# Patient Record
Sex: Male | Born: 1970
Health system: Southern US, Community
[De-identification: ages and names within clinical notes are randomized; demographics above are authoritative.]

## PROBLEM LIST (undated history)

## (undated) DIAGNOSIS — M1990 Unspecified osteoarthritis, unspecified site: Secondary | ICD-10-CM

## (undated) DIAGNOSIS — G709 Myoneural disorder, unspecified: Secondary | ICD-10-CM

## (undated) HISTORY — PX: VASECTOMY: SHX75

## (undated) HISTORY — PX: TOE SURGERY: SHX1073

## (undated) HISTORY — DX: Unspecified osteoarthritis, unspecified site: M19.90

## (undated) HISTORY — PX: EYE SURGERY: SHX253

## (undated) HISTORY — DX: Myoneural disorder, unspecified: G70.9

## (undated) HISTORY — PX: KNEE SURGERY: SHX244

---

## 2006-07-18 ENCOUNTER — Encounter (INDEPENDENT_AMBULATORY_CARE_PROVIDER_SITE_OTHER): Payer: Self-pay | Admitting: Specialist

## 2006-07-18 ENCOUNTER — Ambulatory Visit (HOSPITAL_BASED_OUTPATIENT_CLINIC_OR_DEPARTMENT_OTHER): Admission: RE | Admit: 2006-07-18 | Discharge: 2006-07-18 | Payer: Self-pay | Admitting: Urology

## 2006-08-25 ENCOUNTER — Encounter: Admission: RE | Admit: 2006-08-25 | Discharge: 2006-08-25 | Payer: Self-pay | Admitting: Internal Medicine

## 2009-09-07 ENCOUNTER — Ambulatory Visit (HOSPITAL_BASED_OUTPATIENT_CLINIC_OR_DEPARTMENT_OTHER): Admission: RE | Admit: 2009-09-07 | Discharge: 2009-09-07 | Payer: Self-pay | Admitting: Orthopedic Surgery

## 2010-06-25 LAB — POCT HEMOGLOBIN-HEMACUE: Hemoglobin: 14.7 g/dL (ref 13.0–17.0)

## 2010-08-24 NOTE — Op Note (Signed)
NAME:  DAIMIAN, SUDBERRY              ACCOUNT NO.:  [id number]   MEDICAL RECORD NO.:  [id number]          PATIENT TYPE:  AMB   LOCATION:  [facility]                         FACILITY:  [facility]   PHYSICIAN:  Sigmund I. Patsi Sears, M.D.DATE OF BIRTH:  [birth year]   DATE OF PROCEDURE:  07/18/2006  DATE OF DISCHARGE:                               OPERATIVE REPORT   PREOP DIAGNOSIS:  Left varicocele, and elective sterilization.   POSTOP DIAGNOSIS.:  Left varicocele, and elective sterilization.   OPERATION:  Left internal splenic vein ligation and vasectomy.   SURGEON:  Jethro Bolus, M.D.   ANESTHESIA:  General LMA.   PREPARATION:  After appropriate preanesthesia, the patient was brought  to the operating room and placed supine on the operating room table in a  dorsal supine position where a general LMA anesthesia was induced.  He  remained in this position, where the left inguinal area was washed,  shaved, prepped with Betadine solution, and draped in the usual fashion.   REVIEW OF HISTORY:  This 40 year old man desires to have elective  sterilization, as well as left internal splenic vein ligation for  varicocele, and chronic left testicular pain.  The attention was then  directed to the right hemiscrotum, where the vas was identified.  A 0.5  cm incision was made, subcutaneous tissue dissected, the vas delivered  into the wound.  The vas is doubly clamped, and portion excised with the  electrosurgical unit.  Each end is then ligated with 3-0 Vicryl suture.  The wound was closed in a single 3-0 Vicryl suture. The anesthetic is  delivered into the spermatic cord and the skin wound.   Attention was then directed to the left inguinal canal.  The pubic  tubercle was identified, and a horizontal incision is made measuring 3  cm, two fingerbreadths above the pubic tubercle on the left side.  The  subcutaneous tissue was dissected.  The spermatic cord was identified  distal to the external  ring, and a right-angle clamp was used to dissect  at the level of the pubic tubercle.  A Penrose drain is placed behind  the spermatic cord.  The vas was identified, isolated and doubly  clamped, and a portion removed with the electrosurgical unit.  Each end  is then ligated with 3-0 Vicryl suture.   Attention was then directed to the ring and spermatic cord where  multiple large veins were identified and these were dissected and  ligated with 3-0 ilk free ties.  This tissue was sent to the laboratory  on the left side and labeled as vas plus left internal spermatic veins.  The artery was identified and left intact at the posterior portion of  the spermatic cord.  The spermatic cord was anesthetized with 0.25 plain  Marcaine superiorly and inferiorly, and this was injected into the wound  edges as well.   Closure:  Subcutaneous tissue was closed with running 3-0 Vicryl suture,  and the skin was closed with 4-0 Monocryl running subcuticular closure.  Benzoin and Steri-Strips were applied and the patient was given IV  Toradol.  He was awakened  and taken to the recovery room in good  condition.      Sigmund I. Patsi Sears, M.D.  Electronically Signed     SIT/MEDQ  D:  07/18/2006  T:  07/18/2006  Job:  [id number]

## 2011-05-13 ENCOUNTER — Ambulatory Visit (INDEPENDENT_AMBULATORY_CARE_PROVIDER_SITE_OTHER): Payer: BC Managed Care – PPO | Admitting: Internal Medicine

## 2011-05-13 DIAGNOSIS — F988 Other specified behavioral and emotional disorders with onset usually occurring in childhood and adolescence: Secondary | ICD-10-CM

## 2011-05-13 DIAGNOSIS — G4700 Insomnia, unspecified: Secondary | ICD-10-CM | POA: Insufficient documentation

## 2011-05-13 DIAGNOSIS — M25569 Pain in unspecified knee: Secondary | ICD-10-CM | POA: Insufficient documentation

## 2011-05-13 DIAGNOSIS — M545 Low back pain, unspecified: Secondary | ICD-10-CM

## 2011-05-13 DIAGNOSIS — M159 Polyosteoarthritis, unspecified: Secondary | ICD-10-CM

## 2011-05-13 DIAGNOSIS — F909 Attention-deficit hyperactivity disorder, unspecified type: Secondary | ICD-10-CM

## 2011-05-13 DIAGNOSIS — F4541 Pain disorder exclusively related to psychological factors: Secondary | ICD-10-CM | POA: Insufficient documentation

## 2011-05-13 DIAGNOSIS — F32A Depression, unspecified: Secondary | ICD-10-CM

## 2011-05-13 DIAGNOSIS — M1990 Unspecified osteoarthritis, unspecified site: Secondary | ICD-10-CM

## 2011-05-13 DIAGNOSIS — M549 Dorsalgia, unspecified: Secondary | ICD-10-CM

## 2011-05-13 DIAGNOSIS — F329 Major depressive disorder, single episode, unspecified: Secondary | ICD-10-CM

## 2011-05-13 MED ORDER — AMPHETAMINE-DEXTROAMPHETAMINE 20 MG PO TABS: 20.0000 mg | ORAL_TABLET | Freq: Two times a day (BID) | ORAL | Status: DC

## 2011-05-13 MED ORDER — ZOLPIDEM TARTRATE 10 MG PO TABS: 10.0000 mg | ORAL_TABLET | Freq: Every evening | ORAL | Status: DC | PRN

## 2011-05-13 MED ORDER — BUPROPION HCL ER (XL) 300 MG PO TB24: 300.0000 mg | ORAL_TABLET | Freq: Every day | ORAL | Status: DC

## 2011-05-13 MED ORDER — CYCLOBENZAPRINE HCL 10 MG PO TABS: 10.0000 mg | ORAL_TABLET | Freq: Three times a day (TID) | ORAL | Status: DC | PRN

## 2011-05-14 ENCOUNTER — Encounter: Payer: Self-pay | Admitting: Internal Medicine

## 2011-05-14 DIAGNOSIS — F329 Major depressive disorder, single episode, unspecified: Secondary | ICD-10-CM | POA: Insufficient documentation

## 2011-05-14 DIAGNOSIS — F32A Depression, unspecified: Secondary | ICD-10-CM | POA: Insufficient documentation

## 2011-05-14 LAB — URIC ACID: Uric Acid, Serum: 5.9 mg/dL (ref 4.0–7.8)

## 2011-05-14 LAB — SEDIMENTATION RATE: Sed Rate: 5 mm/hr (ref 0–16)

## 2011-05-14 LAB — RHEUMATOID FACTOR: Rhuematoid fact SerPl-aCnc: <10 IU/mL (ref <=14)

## 2011-05-14 NOTE — Progress Notes (Signed)
  Subjective:    Patient ID: [facility] Miller, male    DOB: 1970-08-28, 41 y.o.   MRN: [id number]  HPIRoger comes in followup of several chronic problems. He has had remarkable progress since starting Adderall for ADD in the fall of 2012. He has had a remarkable effect on his depression and his overall productivity in life. Everyone around him has noticed a difference. He's had no side effects with the medication at this point  Depression and anxiety are well controlled at this point with Wellbutrin. He also has had great success with sleeping after using Ambien. He has no early morning amnesia.  His recent visits to urology for chronic testicular pain  has not resulted in a diagnosis but has led to an attempt to control this with gabapentin which is not successful. We may have to consider radiation from the sacral area has a radiculopathy at some point. He has several degenerative disease processes in his bones and joints. He has had more than one knee surgery and has chronic pain in one knee. He has a lot of back pain which he lives with.he has stiffness in several joints including his hands in the morning. Part of this can be contributes to his difficult work but underlying arthritis is possible and he has never had a consult with rheumatology.    Review of Systems  Constitutional: Negative.   HENT: Negative.   Eyes: Negative for pain and discharge.       Although there are no current complaints his history of eyelid surgery is important.  Respiratory: Negative.   Cardiovascular: Negative.   Gastrointestinal: Negative.   Neurological: Negative for weakness and numbness.       He has chronic tension headaches which currently are responding well to Flexeril at bedtime. These may do well related to chronic neck muscles tenderness.  Psychiatric/Behavioral: Negative for hallucinations, behavioral problems, decreased concentration and agitation.       Objective:   Physical Exam  Constitutional:  He is oriented to person, place, and time. He appears well-developed.  HENT:  Head: Normocephalic.  Eyes: Conjunctivae are normal. Pupils are equal, round, and reactive to light.  Neck: Neck supple. No thyromegaly present.  Cardiovascular: Normal rate and regular rhythm.   Pulmonary/Chest: Breath sounds normal.  Musculoskeletal: Normal range of motion.  Neurological: He is oriented to person, place, and time. He displays normal reflexes.  Psychiatric: He has a normal mood and affect. His behavior is normal. Judgment and thought content normal.  there is marked tightness and some tenderness in the trapezii and posterior cervical muscles with a forward posture. The lumbar area is generally tender although straight leg raise is negative bilaterally. Hands have stiffness at PIP joints without dramatic changes of arthritis.        Assessment & Plan:  Problem #1 chronic degenerative joint problems-a rheumatoid panel will be done with consideration for referral. He may also need chronic pain consideration at some point. He is encouraged to avoid narcotics for pain control at this point.  Problem #2 ADD-doing very well at this point and Adderall be continued  Problem #3 chronic knee pain-he may followup with Dr. Thurston Hole  Problem #4 chronic testicular pain  Problem #5 stress related headaches-continue Flexeril Hs  Problem #6 insomnia-continue Ambien  We will call with his lab results and plan. He may call in 3 months if everything is stable for another 3 months and Adderall.

## 2011-05-18 ENCOUNTER — Encounter: Payer: Self-pay | Admitting: Internal Medicine

## 2011-05-23 ENCOUNTER — Telehealth: Payer: Self-pay

## 2011-05-23 NOTE — Telephone Encounter (Signed)
Pt was seen 05/13/11  [facility] states pt is in office now and they need his last bloodwork Fax [phone] He is there now.

## 2011-05-23 NOTE — Telephone Encounter (Signed)
Need signed release to send records.  Faxed notice to [facility] at 9:55am. Still waiting on release.

## 2011-07-12 ENCOUNTER — Other Ambulatory Visit: Payer: Self-pay | Admitting: Internal Medicine

## 2011-08-07 ENCOUNTER — Other Ambulatory Visit: Payer: Self-pay | Admitting: Physician Assistant

## 2011-08-09 ENCOUNTER — Ambulatory Visit (INDEPENDENT_AMBULATORY_CARE_PROVIDER_SITE_OTHER): Payer: BC Managed Care – PPO | Admitting: Internal Medicine

## 2011-08-09 VITALS — BP 119/73 | HR 89 | Temp 98.1°F | Resp 16 | Ht 70.5 in | Wt 182.8 lb

## 2011-08-09 DIAGNOSIS — F32A Depression, unspecified: Secondary | ICD-10-CM

## 2011-08-09 DIAGNOSIS — F329 Major depressive disorder, single episode, unspecified: Secondary | ICD-10-CM

## 2011-08-09 DIAGNOSIS — M542 Cervicalgia: Secondary | ICD-10-CM

## 2011-08-09 DIAGNOSIS — F909 Attention-deficit hyperactivity disorder, unspecified type: Secondary | ICD-10-CM

## 2011-08-09 DIAGNOSIS — G4700 Insomnia, unspecified: Secondary | ICD-10-CM

## 2011-08-09 MED ORDER — AMPHETAMINE-DEXTROAMPHETAMINE 20 MG PO TABS: 20.0000 mg | ORAL_TABLET | Freq: Two times a day (BID) | ORAL | Status: DC

## 2011-08-09 MED ORDER — GABAPENTIN 100 MG PO CAPS: 300.0000 mg | ORAL_CAPSULE | Freq: Three times a day (TID) | ORAL | Status: DC
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
## 2011-08-21 ENCOUNTER — Other Ambulatory Visit: Payer: Self-pay | Admitting: Internal Medicine

## 2011-08-22 ENCOUNTER — Other Ambulatory Visit: Payer: Self-pay | Admitting: Physical Medicine and Rehabilitation

## 2011-08-22 ENCOUNTER — Telehealth: Payer: Self-pay

## 2011-08-22 ENCOUNTER — Other Ambulatory Visit (HOSPITAL_COMMUNITY): Payer: Self-pay | Admitting: Physical Medicine and Rehabilitation

## 2011-08-22 DIAGNOSIS — M542 Cervicalgia: Secondary | ICD-10-CM

## 2011-08-22 NOTE — Telephone Encounter (Signed)
Faxed over the requested information thru [employer].

## 2011-08-22 NOTE — Telephone Encounter (Signed)
[facility] is requesting a copy of pts recent labs, pt is in the office right now. Fax:[phone] Cb:[phone]

## 2011-08-26 ENCOUNTER — Other Ambulatory Visit (HOSPITAL_COMMUNITY): Payer: Self-pay

## 2011-08-26 ENCOUNTER — Ambulatory Visit
Admission: RE | Admit: 2011-08-26 | Discharge: 2011-08-26 | Disposition: A | Discharge status: Home / Self Care | Payer: Self-pay | Source: Ambulatory Visit | Attending: Physical Medicine and Rehabilitation | Admitting: Physical Medicine and Rehabilitation

## 2011-08-26 VITALS — BP 130/75 | HR 75

## 2011-08-26 DIAGNOSIS — M542 Cervicalgia: Secondary | ICD-10-CM

## 2011-08-26 MED ORDER — ONDANSETRON HCL 4 MG/2ML IJ SOLN: 4.0000 mg | Freq: Four times a day (QID) | INTRAMUSCULAR | Status: DC | PRN

## 2011-08-26 MED ORDER — DIAZEPAM 5 MG PO TABS
10.0000 mg | ORAL_TABLET | Freq: Once | ORAL | Status: AC
  Administered 2011-08-26: 10 mg via ORAL

## 2011-08-26 MED ORDER — IOHEXOL 300 MG/ML  SOLN
10.0000 mL | Freq: Once | INTRAMUSCULAR | Status: AC | PRN
  Administered 2011-08-26: 10 mL via INTRATHECAL

## 2011-08-26 NOTE — Discharge Instructions (Signed)
Myelogram Discharge Instructions  1. Go home and rest quietly for the next 24 hours.  It is important to lie flat for the next 24 hours.  Get up only to go to the restroom.  You may lie in the bed or on a couch on your back, your stomach, your left side or your right side.  You may have one pillow under your head.  You may have pillows between your knees while you are on your side or under your knees while you are on your back.  2. DO NOT drive today.  Recline the seat as far back as it will go, while still wearing your seat belt, on the way home.  3. You may get up to go to the bathroom as needed.  You may sit up for 10 minutes to eat.  You may resume your normal diet and medications unless otherwise indicated.  Drink lots of extra fluids today and tomorrow.  4. The incidence of headache, nausea, or vomiting is about 5% (one in 20 patients).  If you develop a headache, lie flat and drink plenty of fluids until the headache goes away.  Caffeinated beverages may be helpful.  If you develop severe nausea and vomiting or a headache that does not go away with flat bed rest, call [phone].  5. You may resume normal activities after your 24 hours of bed rest is over; however, do not exert yourself strongly or do any heavy lifting tomorrow. If when you get up you have a headache when standing, go back to bed and force fluids for another 24 hours.  6. Call your physician for a follow-up appointment.  The results of your myelogram will be sent directly to your physician by the following day.  7. If you have any questions or if complications develop after you arrive home, please call [phone].  Discharge instructions have been explained to the patient.  The patient, or the person responsible for the patient, fully understands these instructions.       May resume adderall and welbutrin on Aug 27, 2011, after 11:00 am.

## 2011-08-26 NOTE — Progress Notes (Addendum)
Pt states he has been off wellbutrin and adderall for the past 2 days. Discharge instructions explained and consent signed.  1104 pt is comfortable post myelo at present awaiting CT.dd

## 2011-09-23 ENCOUNTER — Other Ambulatory Visit: Payer: Self-pay | Admitting: Physician Assistant

## 2011-10-28 ENCOUNTER — Ambulatory Visit (INDEPENDENT_AMBULATORY_CARE_PROVIDER_SITE_OTHER): Payer: BC Managed Care – PPO | Admitting: Internal Medicine

## 2011-10-28 ENCOUNTER — Encounter: Payer: Self-pay | Admitting: Internal Medicine

## 2011-10-28 VITALS — BP 90/65 | HR 85 | Temp 98.5°F | Resp 16 | Ht 70.5 in | Wt 185.0 lb

## 2011-10-28 DIAGNOSIS — F909 Attention-deficit hyperactivity disorder, unspecified type: Secondary | ICD-10-CM

## 2011-10-28 DIAGNOSIS — F988 Other specified behavioral and emotional disorders with onset usually occurring in childhood and adolescence: Secondary | ICD-10-CM

## 2011-10-28 DIAGNOSIS — G4700 Insomnia, unspecified: Secondary | ICD-10-CM

## 2011-10-28 DIAGNOSIS — M542 Cervicalgia: Secondary | ICD-10-CM

## 2011-10-28 DIAGNOSIS — F329 Major depressive disorder, single episode, unspecified: Secondary | ICD-10-CM

## 2011-10-28 DIAGNOSIS — F32A Depression, unspecified: Secondary | ICD-10-CM

## 2011-10-28 MED ORDER — BUPROPION HCL ER (XL) 300 MG PO TB24: 300.0000 mg | ORAL_TABLET | Freq: Every day | ORAL | Status: DC

## 2011-10-28 MED ORDER — CYCLOBENZAPRINE HCL 10 MG PO TABS: 10.0000 mg | ORAL_TABLET | Freq: Every evening | ORAL | Status: DC | PRN

## 2011-10-28 MED ORDER — AMPHETAMINE-DEXTROAMPHETAMINE 20 MG PO TABS: 20.0000 mg | ORAL_TABLET | Freq: Two times a day (BID) | ORAL | Status: DC

## 2011-10-28 MED ORDER — ZOLPIDEM TARTRATE 10 MG PO TABS: 10.0000 mg | ORAL_TABLET | Freq: Every evening | ORAL | Status: DC | PRN

## 2011-10-28 NOTE — Progress Notes (Signed)
  Subjective:    Patient ID: [facility] Miller, male    DOB: 1970-09-17, 41 y.o.   MRN: [id number]  HPIdoing well considering c-spine injections Patient Active Problem List  Diagnosis  . Osteoarthritis  . Back pain  . Knee pain  . Stress headaches  . ADHD (attention deficit hyperactivity disorder)  . Insomnia  . Depression  . Neck pain-see myelogram  here for refills w/ no new complaints     Review of SystemsNoncontributory     Objective:   Physical Exam Blood pressure 90/65-asymptomatic Neuropsychiatric intact       Assessment & Plan:   1. Depression  buPROPion (WELLBUTRIN XL) 300 MG 24 hr tablet  2. Neck pain  cyclobenzaprine (FLEXERIL) 10 MG tablet  3. Insomnia  zolpidem (AMBIEN) 10 MG tablet  4. ADHD (attention deficit hyperactivity disorder)  amphetamine-dextroamphetamine (ADDERALL) 20 MG tablet, amphetamine-dextroamphetamine (ADDERALL) 20 MG tablet, amphetamine-dextroamphetamine (ADDERALL) 20 MG tablet   Meds ordered this encounter  Medications  . amphetamine-dextroamphetamine (ADDERALL) 20 MG tablet    Sig: Take 1 tablet (20 mg total) by mouth 2 (two) times daily.    Dispense:  60 tablet    Refill:  0  . amphetamine-dextroamphetamine (ADDERALL) 20 MG tablet    Sig: Take 1 tablet (20 mg total) by mouth 2 (two) times daily.    Dispense:  60 tablet    Refill:  0  . amphetamine-dextroamphetamine (ADDERALL) 20 MG tablet    Sig: Take 1 tablet (20 mg total) by mouth 2 (two) times daily.    Dispense:  60 tablet    Refill:  0  . buPROPion (WELLBUTRIN XL) 300 MG 24 hr tablet    Sig: Take 1 tablet (300 mg total) by mouth daily.    Dispense:  90 tablet    Refill:  1  . cyclobenzaprine (FLEXERIL) 10 MG tablet    Sig: Take 1 tablet (10 mg total) by mouth at bedtime as needed for muscle spasms.    Dispense:  90 tablet    Refill:  1  . zolpidem (AMBIEN) 10 MG tablet    Sig: Take 1 tablet (10 mg total) by mouth at bedtime as needed.    Dispense:  30 tablet    Refill:   5   May call in 3 months for adderall refills if all else is stable// plan followup in 6 months

## 2011-11-26 ENCOUNTER — Encounter: Payer: Self-pay | Admitting: Internal Medicine

## 2012-02-04 ENCOUNTER — Telehealth: Payer: Self-pay

## 2012-02-04 DIAGNOSIS — F909 Attention-deficit hyperactivity disorder, unspecified type: Secondary | ICD-10-CM

## 2012-02-04 DIAGNOSIS — F32A Depression, unspecified: Secondary | ICD-10-CM

## 2012-02-04 DIAGNOSIS — F329 Major depressive disorder, single episode, unspecified: Secondary | ICD-10-CM

## 2012-02-04 DIAGNOSIS — G4700 Insomnia, unspecified: Secondary | ICD-10-CM

## 2012-02-04 MED ORDER — AMPHETAMINE-DEXTROAMPHETAMINE 20 MG PO TABS: 20.0000 mg | ORAL_TABLET | Freq: Two times a day (BID) | ORAL | Status: DC

## 2012-02-04 NOTE — Telephone Encounter (Signed)
I have signed 3 months of Adderall refills.  He needs an appointment with Dr. Merla Riches before these run out in January.  He was given Wellbutrin #90 with 1 RF and Ambien #30 with 5 RFs on 10/28/11 so it is too soon to refill those.

## 2012-02-04 NOTE — Telephone Encounter (Signed)
Pt is requesting a 3 month refill on the following medications; adderall, ambien and bupropion. Please call pt to advise if can refill

## 2012-02-04 NOTE — Telephone Encounter (Signed)
Called pt advised Rx up front. Pt understood

## 2012-02-04 NOTE — Telephone Encounter (Signed)
Last seen July, does he need ov or can we do rf's

## 2012-04-02 ENCOUNTER — Ambulatory Visit (INDEPENDENT_AMBULATORY_CARE_PROVIDER_SITE_OTHER): Payer: BC Managed Care – PPO | Admitting: Internal Medicine

## 2012-04-02 VITALS — BP 124/78 | HR 84 | Temp 98.2°F | Resp 18 | Ht 71.0 in | Wt 191.0 lb

## 2012-04-02 DIAGNOSIS — F329 Major depressive disorder, single episode, unspecified: Secondary | ICD-10-CM

## 2012-04-02 DIAGNOSIS — G4700 Insomnia, unspecified: Secondary | ICD-10-CM

## 2012-04-02 DIAGNOSIS — F909 Attention-deficit hyperactivity disorder, unspecified type: Secondary | ICD-10-CM

## 2012-04-02 DIAGNOSIS — F32A Depression, unspecified: Secondary | ICD-10-CM

## 2012-04-02 DIAGNOSIS — M542 Cervicalgia: Secondary | ICD-10-CM

## 2012-04-02 MED ORDER — BUPROPION HCL ER (XL) 300 MG PO TB24: 300.0000 mg | ORAL_TABLET | Freq: Every day | ORAL | Status: DC

## 2012-04-02 MED ORDER — ZOLPIDEM TARTRATE 10 MG PO TABS: 10.0000 mg | ORAL_TABLET | Freq: Every evening | ORAL | Status: DC | PRN

## 2012-04-02 MED ORDER — AMPHETAMINE-DEXTROAMPHETAMINE 20 MG PO TABS: 20.0000 mg | ORAL_TABLET | Freq: Two times a day (BID) | ORAL | Status: DC

## 2012-04-02 MED ORDER — CYCLOBENZAPRINE HCL 10 MG PO TABS: 10.0000 mg | ORAL_TABLET | Freq: Every evening | ORAL | Status: DC | PRN

## 2012-04-02 MED ORDER — GABAPENTIN 300 MG PO CAPS: 300.0000 mg | ORAL_CAPSULE | Freq: Three times a day (TID) | ORAL | Status: DC

## 2012-04-02 NOTE — Progress Notes (Signed)
  Subjective:    Patient ID: [facility], male    DOB: 25-Jan-1971, 41 y.o.   MRN: [id number]  HPI followup for medication refills Patient Active Problem List  Diagnosis  . Osteoarthritis  . Back pain  . Knee pain  . Stress headaches  . ADHD (attention deficit hyperactivity disorder)  . Insomnia  . Depression  . Neck pain   Adderall continues to provide a great benefit at work and with organization at home and his mood is much better on this medication Depression continues to respond to Wellbutrin He continues to require Ambien for sleep but does well   Ongoing problems with bone and joint pain Neck injection helped/continues to work on these issues and has a good attitude   Review of Systems  Constitutional: Negative for activity change, appetite change and unexpected weight change.  HENT: Negative for trouble swallowing and voice change.   Eyes: Negative for visual disturbance.  Respiratory: Negative for shortness of breath.   Cardiovascular: Negative for chest pain, palpitations and leg swelling.  Gastrointestinal: Negative for abdominal pain.  Neurological: Negative for tremors, weakness and headaches.       Objective:   Physical Exam Vital signs stable HEENT clear except ptosis of the right eye Neck with mild decreased range of motion secondary to stiffness and discomfort Heart regular Neurological intact with ptosis of the right eye Mood good/affect stable      Assessment & Plan:   1. Insomnia  zolpidem (AMBIEN) 10 MG tablet  2. Depression  buPROPion (WELLBUTRIN XL) 300 MG 24 hr tablet  3. ADHD (attention deficit hyperactivity disorder)  amphetamine-dextroamphetamine (ADDERALL) 20 MG tablet, amphetamine-dextroamphetamine (ADDERALL) 20 MG tablet, amphetamine-dextroamphetamine (ADDERALL) 20 MG tablet  4. Neck pain  cyclobenzaprine (FLEXERIL) 10 MG tablet, gabapentin (NEURONTIN) 300 MG capsule, DISCONTINUED: gabapentin (NEURONTIN) 300 MG capsule   May call in  3 months for 3 more prescriptions and followup in 6 months Continue orthopedic followup

## 2012-07-05 ENCOUNTER — Ambulatory Visit (INDEPENDENT_AMBULATORY_CARE_PROVIDER_SITE_OTHER): Payer: BC Managed Care – PPO | Admitting: Internal Medicine

## 2012-07-05 VITALS — BP 111/77 | HR 86 | Temp 98.2°F | Resp 18 | Ht 71.0 in | Wt 188.2 lb

## 2012-07-05 DIAGNOSIS — F909 Attention-deficit hyperactivity disorder, unspecified type: Secondary | ICD-10-CM

## 2012-07-05 DIAGNOSIS — F329 Major depressive disorder, single episode, unspecified: Secondary | ICD-10-CM

## 2012-07-05 DIAGNOSIS — N50819 Testicular pain, unspecified: Secondary | ICD-10-CM

## 2012-07-05 DIAGNOSIS — F988 Other specified behavioral and emotional disorders with onset usually occurring in childhood and adolescence: Secondary | ICD-10-CM

## 2012-07-05 DIAGNOSIS — M542 Cervicalgia: Secondary | ICD-10-CM

## 2012-07-05 DIAGNOSIS — H5461 Unqualified visual loss, right eye, normal vision left eye: Secondary | ICD-10-CM | POA: Insufficient documentation

## 2012-07-05 DIAGNOSIS — G4700 Insomnia, unspecified: Secondary | ICD-10-CM

## 2012-07-05 DIAGNOSIS — F32A Depression, unspecified: Secondary | ICD-10-CM

## 2012-07-05 MED ORDER — BUPROPION HCL ER (XL) 300 MG PO TB24: 300.0000 mg | ORAL_TABLET | Freq: Every day | ORAL | Status: DC

## 2012-07-05 MED ORDER — ZOLPIDEM TARTRATE 10 MG PO TABS: 10.0000 mg | ORAL_TABLET | Freq: Every evening | ORAL | Status: DC | PRN

## 2012-07-05 MED ORDER — AMPHETAMINE-DEXTROAMPHETAMINE 20 MG PO TABS: 20.0000 mg | ORAL_TABLET | Freq: Two times a day (BID) | ORAL | Status: DC
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
## 2012-10-09 ENCOUNTER — Telehealth: Payer: Self-pay

## 2012-10-09 DIAGNOSIS — F909 Attention-deficit hyperactivity disorder, unspecified type: Secondary | ICD-10-CM

## 2012-10-09 NOTE — Telephone Encounter (Signed)
Pt is needing to get a refill on adderall  [phone] Best number

## 2012-10-12 MED ORDER — AMPHETAMINE-DEXTROAMPHETAMINE 20 MG PO TABS: 20.0000 mg | ORAL_TABLET | Freq: Two times a day (BID) | ORAL | Status: DC

## 2012-10-12 NOTE — Telephone Encounter (Signed)
Rx printed and ready for pick up.  

## 2012-10-12 NOTE — Telephone Encounter (Signed)
Patient advised.

## 2012-12-07 ENCOUNTER — Ambulatory Visit (INDEPENDENT_AMBULATORY_CARE_PROVIDER_SITE_OTHER): Payer: BC Managed Care – PPO | Admitting: Internal Medicine

## 2012-12-07 VITALS — BP 118/66 | HR 111 | Temp 99.0°F | Resp 18 | Wt 181.4 lb

## 2012-12-07 DIAGNOSIS — F909 Attention-deficit hyperactivity disorder, unspecified type: Secondary | ICD-10-CM

## 2012-12-07 DIAGNOSIS — G4700 Insomnia, unspecified: Secondary | ICD-10-CM

## 2012-12-07 DIAGNOSIS — F329 Major depressive disorder, single episode, unspecified: Secondary | ICD-10-CM

## 2012-12-07 DIAGNOSIS — M542 Cervicalgia: Secondary | ICD-10-CM

## 2012-12-07 DIAGNOSIS — F32A Depression, unspecified: Secondary | ICD-10-CM

## 2012-12-07 MED ORDER — AMPHETAMINE-DEXTROAMPHETAMINE 20 MG PO TABS: 20.0000 mg | ORAL_TABLET | Freq: Two times a day (BID) | ORAL | Status: DC

## 2012-12-07 MED ORDER — BUPROPION HCL ER (XL) 300 MG PO TB24: 300.0000 mg | ORAL_TABLET | Freq: Every day | ORAL | Status: DC

## 2012-12-07 MED ORDER — GABAPENTIN 300 MG PO CAPS: 300.0000 mg | ORAL_CAPSULE | Freq: Three times a day (TID) | ORAL | Status: DC

## 2012-12-07 MED ORDER — ZOLPIDEM TARTRATE 10 MG PO TABS: 10.0000 mg | ORAL_TABLET | Freq: Every evening | ORAL | Status: DC | PRN

## 2012-12-07 MED ORDER — CYCLOBENZAPRINE HCL 10 MG PO TABS: 10.0000 mg | ORAL_TABLET | Freq: Every evening | ORAL | Status: DC | PRN
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
## 2013-01-01 ENCOUNTER — Other Ambulatory Visit: Payer: Self-pay | Admitting: Urology

## 2013-01-01 DIAGNOSIS — N50819 Testicular pain, unspecified: Secondary | ICD-10-CM

## 2013-01-06 ENCOUNTER — Other Ambulatory Visit: Payer: Self-pay

## 2013-01-18 ENCOUNTER — Other Ambulatory Visit: Payer: Self-pay

## 2013-01-21 ENCOUNTER — Ambulatory Visit (INDEPENDENT_AMBULATORY_CARE_PROVIDER_SITE_OTHER): Payer: BC Managed Care – PPO | Admitting: Family Medicine

## 2013-01-21 VITALS — BP 116/80 | HR 104 | Temp 98.8°F | Resp 16 | Ht 71.0 in | Wt 176.0 lb

## 2013-01-21 DIAGNOSIS — J029 Acute pharyngitis, unspecified: Secondary | ICD-10-CM

## 2013-01-21 DIAGNOSIS — R599 Enlarged lymph nodes, unspecified: Secondary | ICD-10-CM

## 2013-01-21 DIAGNOSIS — R590 Localized enlarged lymph nodes: Secondary | ICD-10-CM

## 2013-01-21 LAB — POCT RAPID STREP A (OFFICE): Rapid Strep A Screen: NEGATIVE

## 2013-01-21 LAB — POCT CBC
Granulocyte percent: 52.7 %G (ref 37–80)
HCT, POC: 42.4 % — AB (ref 43.5–53.7)
Hemoglobin: 13.4 g/dL — AB (ref 14.1–18.1)
Lymph, poc: 2.4 (ref 0.6–3.4)
MCH, POC: 29.6 pg (ref 27–31.2)
MCHC: 31.6 g/dL — AB (ref 31.8–35.4)
MCV: 93.6 fL (ref 80–97)
MID (cbc): 0.5 (ref 0–0.9)
MPV: 7.3 fL (ref 0–99.8)
POC Granulocyte: 3.2 (ref 2–6.9)
POC LYMPH PERCENT: 39.7 %L (ref 10–50)
POC MID %: 7.6 %M (ref 0–12)
Platelet Count, POC: 317 10*3/uL (ref 142–424)
RBC: 4.53 M/uL — AB (ref 4.69–6.13)
RDW, POC: 13.3 %
WBC: 6 10*3/uL (ref 4.6–10.2)

## 2013-01-21 NOTE — Progress Notes (Signed)
[address]   [place], [place]  [address]   [phone]  Subjective:    Patient ID: Stanley Miller, male    DOB: [birth year], 42 y.o.   MRN: [id number]  HPI This 42 y.o. male presents for evaluation of neck nodule/swelling.  Noticed swelling along L posterior neck.  No fever/chills/sweats.  Non-tender.  +recent sore throat last week, but no rhinorrhea, nasal congestion, ear pain.  No scalp irritation, dandruff, hair loss.  No recent cough.  Concerned about etiology.  Review of Systems  Constitutional: Negative for fever, chills, diaphoresis and fatigue.  HENT: Positive for sore throat. Negative for dental problem, ear discharge, ear pain, facial swelling, postnasal drip, rhinorrhea, sinus pressure, sneezing, trouble swallowing and voice change.   Respiratory: Negative for cough.   Skin: Negative for color change, pallor, rash and wound.  Neurological: Negative for headaches.  Hematological: Positive for adenopathy. Does not bruise/bleed easily.   Past Medical History  Diagnosis Date  . Neuromuscular disorder   . Arthritis    Past Surgical History  Procedure Laterality Date  . Knee surgery  twice  . Vasectomy    . Eye surgery     No Known Allergies Current Outpatient Prescriptions on File Prior to Visit  Medication Sig Dispense Refill  . cyclobenzaprine (FLEXERIL) 10 MG tablet Take 1 tablet (10 mg total) by mouth at bedtime as needed for muscle spasms.  90 tablet  1  . gabapentin (NEURONTIN) 300 MG capsule Take 1 capsule (300 mg total) by mouth 3 (three) times daily.  270 capsule  1   No current facility-administered medications on file prior to visit.   History   Social History  . Marital Status: Single    Spouse Name: N/A    Number of Children: N/A  . Years of Education: N/A   Occupational History  . Not on file.   Social History Main Topics  . Smoking status: Former Games developer  . Smokeless tobacco: Never Used     Comment: Quit 15 yrs ago  . Alcohol Use: No  .  Drug Use: No  . Sexual Activity: Yes    Birth Control/ Protection: Surgical   Other Topics Concern  . Not on file   Social History Narrative  . No narrative on file   Family History  Problem Relation Age of Onset  . Diabetes Mother   . Heart disease Father   . Cancer Maternal Grandmother        Objective:   Physical Exam  Nursing note and vitals reviewed. Constitutional: He is oriented to person, place, and time. He appears well-developed and well-nourished. No distress.  HENT:  Head: Normocephalic and atraumatic.  Right Ear: External ear normal.  Left Ear: External ear normal.  Nose: Nose normal.  Mouth/Throat: Oropharynx is clear and moist.  Eyes: Conjunctivae and EOM are normal. Pupils are equal, round, and reactive to light.  Neck: Normal range of motion and full passive range of motion without pain. Neck supple. Carotid bruit is not present. No thyromegaly present.  Cardiovascular: Normal rate, regular rhythm, normal heart sounds and intact distal pulses.  Exam reveals no gallop and no friction rub.   No murmur heard. Pulmonary/Chest: Effort normal and breath sounds normal. He has no wheezes. He has no rales.  Lymphadenopathy:       Head (right side): No submental, no submandibular, no tonsillar, no preauricular, no posterior auricular and no occipital adenopathy present.       Head (left side):  Occipital adenopathy present. No submental, no submandibular, no tonsillar, no preauricular and no posterior auricular adenopathy present.    He has no cervical adenopathy.  Neurological: He is alert and oriented to person, place, and time. No cranial nerve deficit.  Skin: Skin is warm and dry. No rash noted. He is not diaphoretic.  No scalp irritation, hair loss.  Psychiatric: He has a normal mood and affect. His behavior is normal.    Results for orders placed in visit on 01/21/13  POCT RAPID STREP A (OFFICE)      Result Value Range   Rapid Strep A Screen Negative   Negative  POCT CBC      Result Value Range   WBC 6.0  4.6 - 10.2 K/uL   Lymph, poc 2.4  0.6 - 3.4   POC LYMPH PERCENT 39.7  10 - 50 %L   MID (cbc) 0.5  0 - 0.9   POC MID % 7.6  0 - 12 %M   POC Granulocyte 3.2  2 - 6.9   Granulocyte percent 52.7  37 - 80 %G   RBC 4.53 (*) 4.69 - 6.13 M/uL   Hemoglobin 13.4 (*) 14.1 - 18.1 g/dL   HCT, POC 29.5 (*) 62.1 - 53.7 %   MCV 93.6  80 - 97 fL   MCH, POC 29.6  27 - 31.2 pg   MCHC 31.6 (*) 31.8 - 35.4 g/dL   RDW, POC 30.8     Platelet Count, POC 317  142 - 424 K/uL   MPV 7.3  0 - 99.8 fL      Assessment & Plan:  Cervical adenopathy - Plan: POCT CBC  Sore throat - Plan: POCT rapid strep A, Culture, Group A Strep   1. L posterior cervical LAD:  New.  Likely consistent with viral etiology. Expect persistent LAD for three weeks.  Recommend return to office in three weeks to confirm resolution. If LAD still present, treat with Augmentin. If persists, refer to ENT for CT neck and further evaluation. 2.  Sore throat:  New in past week and now improved; obtain throat culture which expect to be negative.  No orders of the defined types were placed in this encounter.   Nilda Simmer, M.D.  [facility] [address] [place], [place]  [address] [phone] phone [phone] fax

## 2013-01-24 LAB — CULTURE, GROUP A STREP: Organism ID, Bacteria: NORMAL

## 2013-03-11 IMAGING — CT CT CERVICAL SPINE W/ CM
3 of 4 series · 16 of 28 positions shown, 18 images · non-contrast
Comparison: 08/25/2006

CLINICAL DATA: Neck pain.  Bilateral upper extremity pain and
weakness.  Improved after steroid treatment with some residual left
finger numbness.

CT MYELOGRAPHY CERVICAL SPINE
TECHNIQUE: CT imaging of the cervical spine was performed after
intrathecal contrast administration. Multiplanar CT image
reconstructions were also generated.

[Series 2: c spine bone · axial · 0.27mm/px · z∈[+68,+188]mm · 5 of 74 slices shown, 7 images]
[im 13/74  soft-tissue]
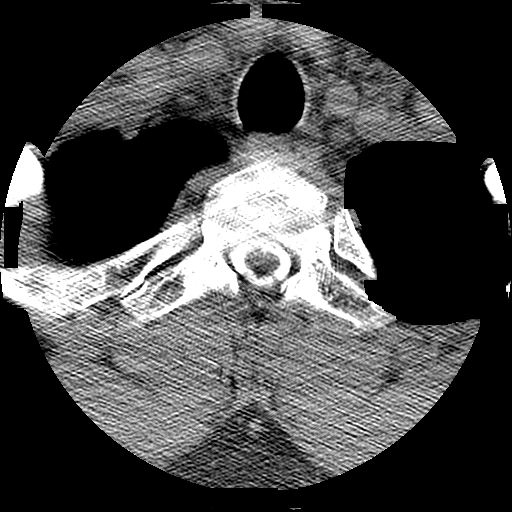
[im 13/74  bone]
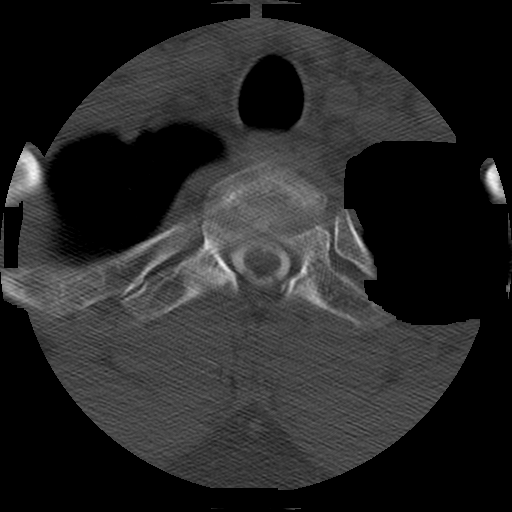
[im 25/74  bone]
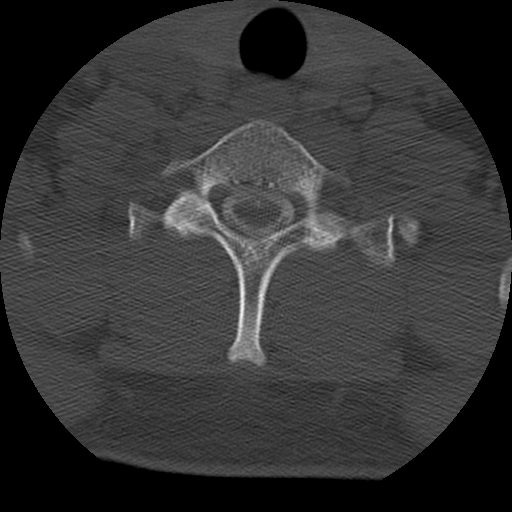
[im 37/74  bone]
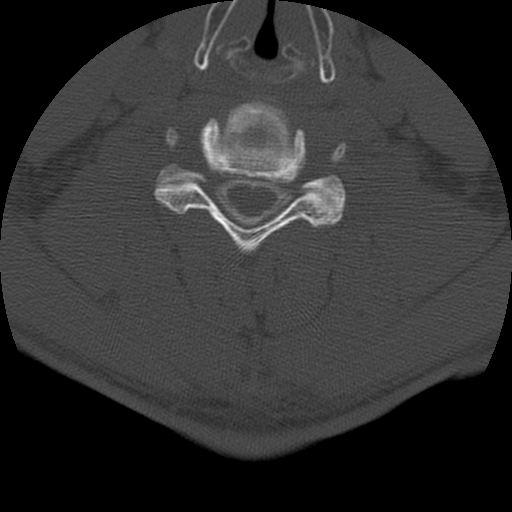
[im 49/74  bone]
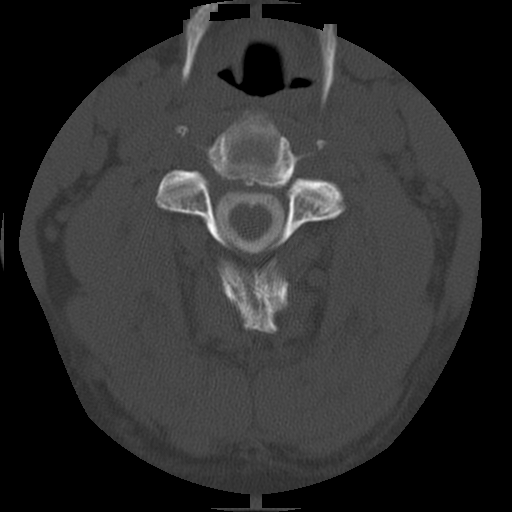
[im 61/74  soft-tissue]
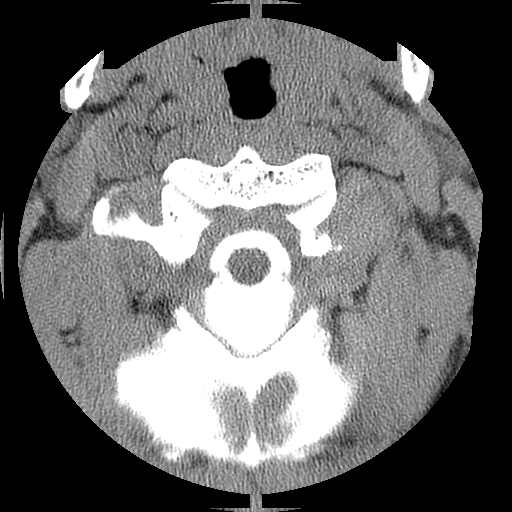
[im 61/74  bone]
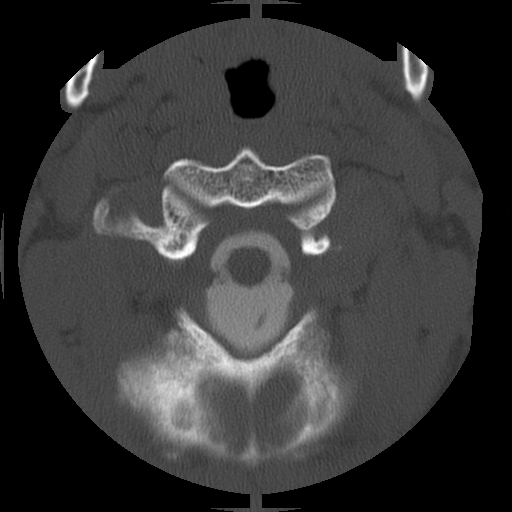

[Series 3: c spine soft · axial · 0.27mm/px · z∈[+68,+188]mm · 5 of 74 slices shown]
[im 13/74  soft-tissue]
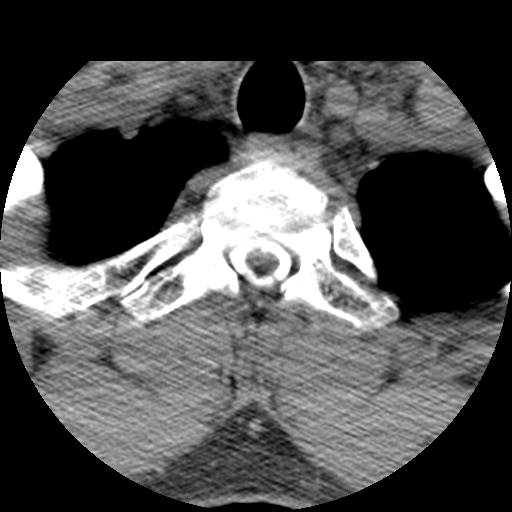
[im 25/74  soft-tissue]
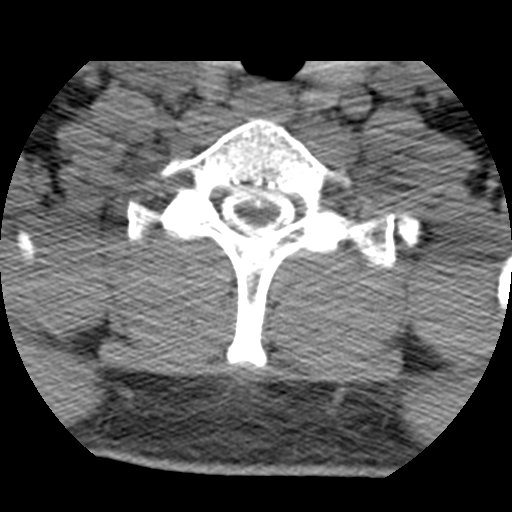
[im 37/74  soft-tissue]
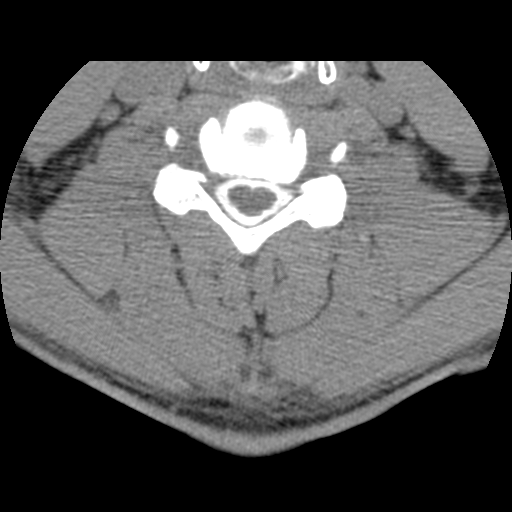
[im 49/74  soft-tissue]
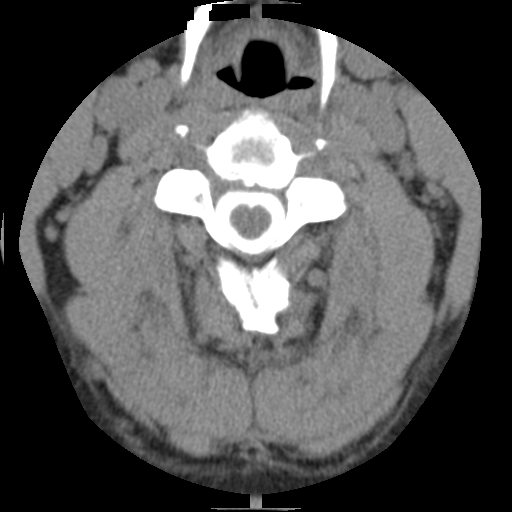
[im 61/74  soft-tissue]
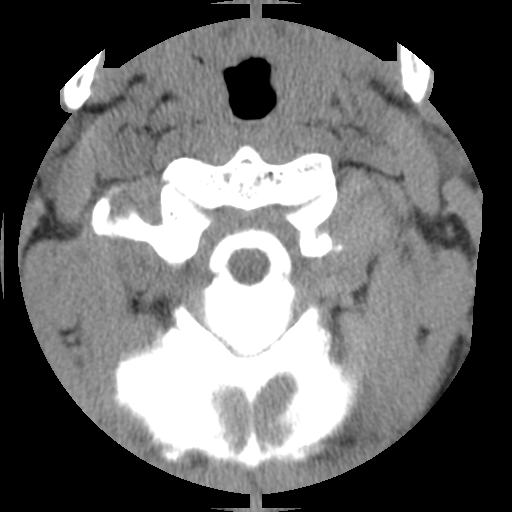

[Series 400: coronal · coronal · 0.37mm/px · 6 of 49 slices shown]
[im 3/49  soft-tissue]
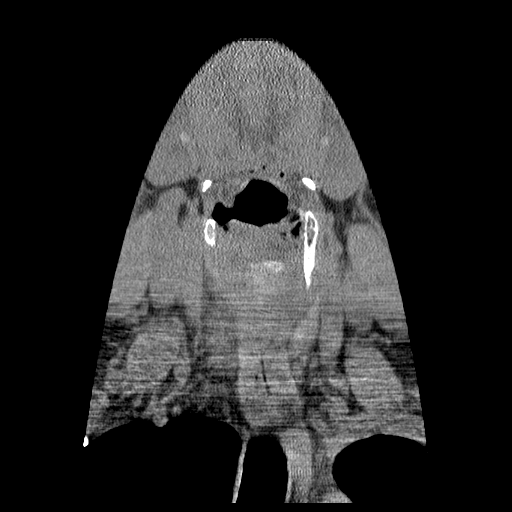
[im 7/49  bone]
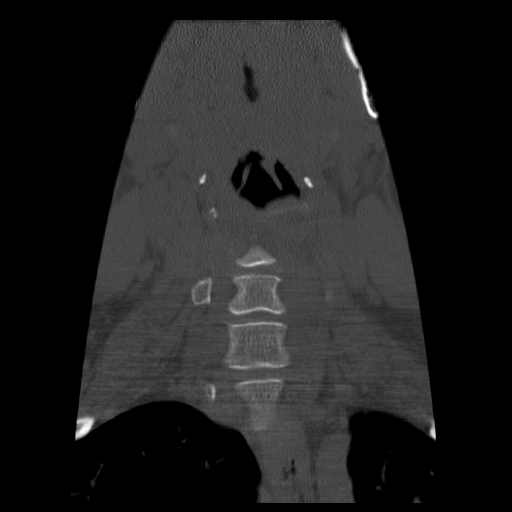
[im 14/49  bone]
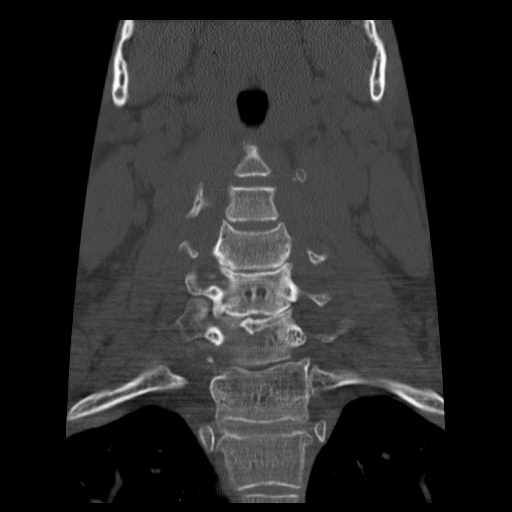
[im 21/49  bone]
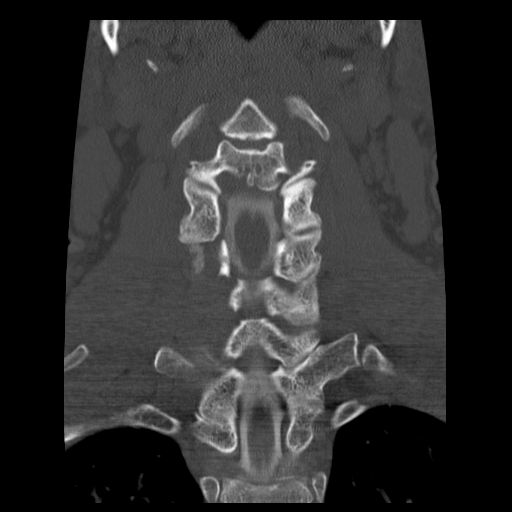
[im 28/49  bone]
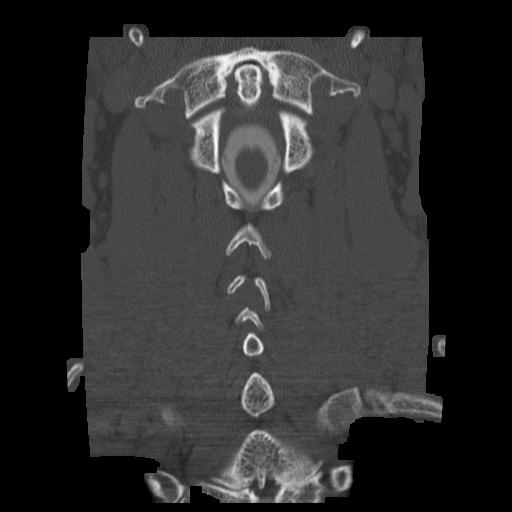
[im 35/49  bone]
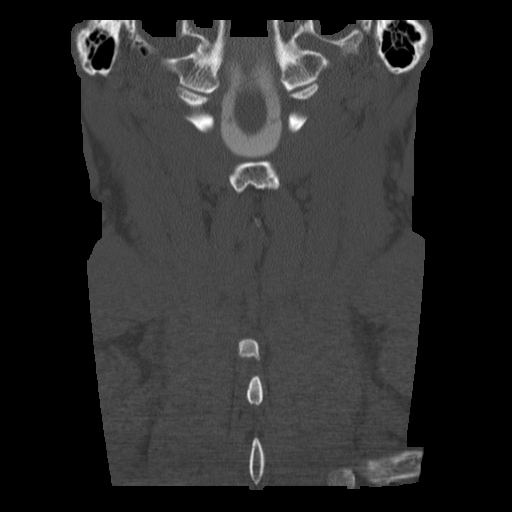

[16 of 28 positions shown; findings below may reference images not displayed]

FINDINGS: Anatomic alignment.  No Chiari malformation.  No
vertebral compression deformity.  Lung apices clear.  Unremarkable
thyroid.

C2-3:  Minimal left paracentral posterior osteophytic ridging
without compromise.

C3-4:  Uncovertebral and facet osteophytes cause significant left
foraminal narrowing.  Minimal right foraminal narrowing.  No
central stenosis.

C4-5:  Minimal posterior osteophytic ridging.

C5-6:  Moderate posterior osteophytic ridging asymmetric to the
left without significant central stenosis.  Minimal uncovertebral
osteophytes  with minimal left foraminal stenosis.

C6-7:  Central posterior osteophytes indent the anterior aspect of
the cord causing moderate central stenosis.  Uncovertebral
osteophytes cause severe right foraminal stenosis.  Left foramen is
patent.

C7-T1:  Shallow left paracentral disc protrusion effaces the thecal
sac to the cord.  No significant central stenosis.  Patent
foramina.
IMPRESSION: Central posterior osteophytes indent the cord at C6-7 causing
moderate central stenosis.

Severe right foraminal stenosis at C6-7 secondary to uncovertebral
osteophytes.

Less significant foraminal stenosis at other levels secondary to
uncovertebral osteophytes.

Shallow left paracentral protrusion at C7-T1.

## 2013-04-04 ENCOUNTER — Ambulatory Visit (INDEPENDENT_AMBULATORY_CARE_PROVIDER_SITE_OTHER): Payer: BC Managed Care – PPO | Admitting: Internal Medicine

## 2013-04-04 VITALS — BP 120/80 | HR 86 | Temp 98.4°F | Resp 16 | Ht 70.0 in | Wt 179.0 lb

## 2013-04-04 DIAGNOSIS — F909 Attention-deficit hyperactivity disorder, unspecified type: Secondary | ICD-10-CM

## 2013-04-04 DIAGNOSIS — G4700 Insomnia, unspecified: Secondary | ICD-10-CM

## 2013-04-04 DIAGNOSIS — M542 Cervicalgia: Secondary | ICD-10-CM

## 2013-04-04 DIAGNOSIS — F988 Other specified behavioral and emotional disorders with onset usually occurring in childhood and adolescence: Secondary | ICD-10-CM

## 2013-04-04 DIAGNOSIS — F32A Depression, unspecified: Secondary | ICD-10-CM

## 2013-04-04 DIAGNOSIS — F329 Major depressive disorder, single episode, unspecified: Secondary | ICD-10-CM

## 2013-04-04 DIAGNOSIS — M549 Dorsalgia, unspecified: Secondary | ICD-10-CM

## 2013-04-04 MED ORDER — AMPHETAMINE-DEXTROAMPHETAMINE 20 MG PO TABS: 20.0000 mg | ORAL_TABLET | Freq: Two times a day (BID) | ORAL | Status: DC

## 2013-04-04 MED ORDER — BUPROPION HCL ER (XL) 300 MG PO TB24: 300.0000 mg | ORAL_TABLET | Freq: Every day | ORAL | Status: DC
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
## 2013-04-22 ENCOUNTER — Ambulatory Visit
Admission: RE | Admit: 2013-04-22 | Discharge: 2013-04-22 | Disposition: A | Discharge status: Home / Self Care | Payer: BC Managed Care – PPO | Source: Ambulatory Visit | Attending: Urology | Admitting: Urology

## 2013-04-22 DIAGNOSIS — N50819 Testicular pain, unspecified: Secondary | ICD-10-CM

## 2013-06-22 ENCOUNTER — Other Ambulatory Visit: Payer: Self-pay | Admitting: Internal Medicine

## 2013-06-22 NOTE — Telephone Encounter (Signed)
Ready

## 2013-06-23 NOTE — Telephone Encounter (Signed)
faxed

## 2013-07-03 ENCOUNTER — Ambulatory Visit (INDEPENDENT_AMBULATORY_CARE_PROVIDER_SITE_OTHER): Payer: BC Managed Care – PPO | Admitting: Internal Medicine

## 2013-07-03 VITALS — BP 118/70 | HR 84 | Temp 98.0°F | Resp 16 | Ht 72.0 in | Wt 179.0 lb

## 2013-07-03 DIAGNOSIS — G4700 Insomnia, unspecified: Secondary | ICD-10-CM

## 2013-07-03 DIAGNOSIS — F329 Major depressive disorder, single episode, unspecified: Secondary | ICD-10-CM

## 2013-07-03 DIAGNOSIS — F988 Other specified behavioral and emotional disorders with onset usually occurring in childhood and adolescence: Secondary | ICD-10-CM

## 2013-07-03 DIAGNOSIS — F909 Attention-deficit hyperactivity disorder, unspecified type: Secondary | ICD-10-CM

## 2013-07-03 DIAGNOSIS — F32A Depression, unspecified: Secondary | ICD-10-CM

## 2013-07-03 MED ORDER — AMPHETAMINE-DEXTROAMPHETAMINE 20 MG PO TABS: 20.0000 mg | ORAL_TABLET | Freq: Two times a day (BID) | ORAL | Status: DC

## 2013-07-03 MED ORDER — ZOLPIDEM TARTRATE 10 MG PO TABS: ORAL_TABLET | ORAL | Status: DC

## 2013-07-03 NOTE — Progress Notes (Signed)
F/u for add Doing well on meds w/out side eff depr stable No PT yet Insomnia responds well to Ambien  Exam BP 118/70  Pulse 84  Temp(Src) 98 F (36.7 C) (Oral)  Resp 16  Ht 6' (1.829 m)  Wt 179 lb (81.194 kg)  BMI 24.27 kg/m2  SpO2 98% Remainder benign  ADHD (attention deficit hyperactivity disorder) - Plan: amphetamine-dextroamphetamine (ADDERALL) 20 MG tablet, amphetamine-dextroamphetamine (ADDERALL) 20 MG tablet, amphetamine-dextroamphetamine (ADDERALL) 20 MG tablet  Insomnia, unspecified  Depression  Meds ordered this encounter  Medications  . amphetamine-dextroamphetamine (ADDERALL) 20 MG tablet    Sig: Take 1 tablet (20 mg total) by mouth 2 (two) times daily. For 60 d after date signed    Dispense:  60 tablet    Refill:  0  . amphetamine-dextroamphetamine (ADDERALL) 20 MG tablet    Sig: Take 1 tablet (20 mg total) by mouth 2 (two) times daily. For 30 d after date signed    Dispense:  60 tablet    Refill:  0  . amphetamine-dextroamphetamine (ADDERALL) 20 MG tablet    Sig: Take 1 tablet (20 mg total) by mouth 2 (two) times daily.    Dispense:  60 tablet    Refill:  0  . zolpidem (AMBIEN) 10 MG tablet    Sig: TAKE 1 TABLET BY MOUTH ONCE DAILY AT BEDTIME AS NEEDED    Dispense:  30 tablet    Refill:  5

## 2013-07-25 ENCOUNTER — Telehealth: Payer: Self-pay

## 2013-07-25 NOTE — Telephone Encounter (Signed)
Patient needs call back to discuss prescription [phone]

## 2013-07-25 NOTE — Telephone Encounter (Signed)
lmom to cb. 

## 2013-07-26 NOTE — Telephone Encounter (Signed)
LMVM to CB. 

## 2013-07-27 NOTE — Telephone Encounter (Signed)
LMVM to CB. 

## 2013-07-27 NOTE — Telephone Encounter (Signed)
Unable to reach letter sent

## 2013-11-05 ENCOUNTER — Ambulatory Visit (INDEPENDENT_AMBULATORY_CARE_PROVIDER_SITE_OTHER): Payer: BC Managed Care – PPO | Admitting: Internal Medicine

## 2013-11-05 VITALS — BP 106/78 | HR 81 | Temp 98.2°F | Resp 16 | Ht 70.0 in | Wt 175.0 lb

## 2013-11-05 DIAGNOSIS — F909 Attention-deficit hyperactivity disorder, unspecified type: Secondary | ICD-10-CM

## 2013-11-05 DIAGNOSIS — F900 Attention-deficit hyperactivity disorder, predominantly inattentive type: Secondary | ICD-10-CM

## 2013-11-05 DIAGNOSIS — F988 Other specified behavioral and emotional disorders with onset usually occurring in childhood and adolescence: Secondary | ICD-10-CM

## 2013-11-05 DIAGNOSIS — G4700 Insomnia, unspecified: Secondary | ICD-10-CM

## 2013-11-05 DIAGNOSIS — M545 Low back pain, unspecified: Secondary | ICD-10-CM

## 2013-11-05 DIAGNOSIS — R5383 Other fatigue: Principal | ICD-10-CM

## 2013-11-05 DIAGNOSIS — F3289 Other specified depressive episodes: Secondary | ICD-10-CM

## 2013-11-05 DIAGNOSIS — F329 Major depressive disorder, single episode, unspecified: Secondary | ICD-10-CM

## 2013-11-05 DIAGNOSIS — F32A Depression, unspecified: Secondary | ICD-10-CM

## 2013-11-05 DIAGNOSIS — R5381 Other malaise: Secondary | ICD-10-CM

## 2013-11-05 LAB — POCT CBC
Granulocyte percent: 63.2 %G (ref 37–80)
HCT, POC: 42.8 % — AB (ref 43.5–53.7)
Hemoglobin: 14.6 g/dL (ref 14.1–18.1)
Lymph, poc: 2 (ref 0.6–3.4)
MCH, POC: 30.6 pg (ref 27–31.2)
MCHC: 34.2 g/dL (ref 31.8–35.4)
MCV: 89.6 fL (ref 80–97)
MID (cbc): 0.3 (ref 0–0.9)
MPV: 6.4 fL (ref 0–99.8)
POC Granulocyte: 3.9 (ref 2–6.9)
POC LYMPH PERCENT: 31.8 %L (ref 10–50)
POC MID %: 5 %M (ref 0–12)
Platelet Count, POC: 299 10*3/uL (ref 142–424)
RBC: 4.77 M/uL (ref 4.69–6.13)
RDW, POC: 13.2 %
WBC: 6.2 10*3/uL (ref 4.6–10.2)

## 2013-11-05 LAB — TSH: TSH: 0.939 u[IU]/mL (ref 0.350–4.500)

## 2013-11-05 LAB — COMPREHENSIVE METABOLIC PANEL
ALT: 21 U/L (ref 0–53)
AST: 16 U/L (ref 0–37)
Albumin: 4.5 g/dL (ref 3.5–5.2)
Alkaline Phosphatase: 61 U/L (ref 39–117)
BUN: 13 mg/dL (ref 6–23)
CO2: 26 mEq/L (ref 19–32)
Calcium: 9.6 mg/dL (ref 8.4–10.5)
Chloride: 103 mEq/L (ref 96–112)
Creat: 0.94 mg/dL (ref 0.50–1.35)
Glucose, Bld: 85 mg/dL (ref 70–99)
Potassium: 3.9 mEq/L (ref 3.5–5.3)
Sodium: 138 mEq/L (ref 135–145)
Total Bilirubin: 0.6 mg/dL (ref 0.2–1.2)
Total Protein: 7.3 g/dL (ref 6.0–8.3)

## 2013-11-05 LAB — POCT SEDIMENTATION RATE: POCT SED RATE: 15 mm/hr (ref 0–22)

## 2013-11-05 MED ORDER — AMPHETAMINE-DEXTROAMPHETAMINE 20 MG PO TABS: 20.0000 mg | ORAL_TABLET | Freq: Two times a day (BID) | ORAL | Status: DC

## 2013-11-05 MED ORDER — ZOLPIDEM TARTRATE 10 MG PO TABS: ORAL_TABLET | ORAL | Status: DC

## 2013-11-05 MED ORDER — BUPROPION HCL ER (XL) 300 MG PO TB24: 300.0000 mg | ORAL_TABLET | Freq: Every day | ORAL | Status: DC
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
## 2013-11-07 ENCOUNTER — Encounter: Payer: Self-pay | Admitting: Internal Medicine

## 2013-11-08 LAB — TESTOSTERONE, FREE, TOTAL, SHBG
Sex Hormone Binding: 25 nmol/L (ref 13–71)
Testosterone, Free: 74.8 pg/mL (ref 47.0–244.0)
Testosterone-% Free: 2.3 % (ref 1.6–2.9)
Testosterone: 324 ng/dL (ref 300–890)

## 2013-11-11 ENCOUNTER — Telehealth: Payer: Self-pay | Admitting: *Deleted

## 2013-11-11 NOTE — Telephone Encounter (Signed)
No results to give pt.  Please give instructions to pt.

## 2013-11-11 NOTE — Telephone Encounter (Signed)
Labs looked ok

## 2013-11-11 NOTE — Telephone Encounter (Signed)
Can we please review pt lab work.   Can call him on cell [phone] and can leave results on vm.

## 2013-11-11 NOTE — Telephone Encounter (Signed)
Left detailed message on machine for normal labs.

## 2013-12-06 DIAGNOSIS — N529 Male erectile dysfunction, unspecified: Secondary | ICD-10-CM | POA: Insufficient documentation

## 2014-03-01 ENCOUNTER — Other Ambulatory Visit: Payer: Self-pay

## 2014-03-01 DIAGNOSIS — F900 Attention-deficit hyperactivity disorder, predominantly inattentive type: Secondary | ICD-10-CM

## 2014-03-01 NOTE — Telephone Encounter (Signed)
Pt requesting 3 month supply of his Adderall   Best phone for pt is [phone]

## 2014-03-02 MED ORDER — AMPHETAMINE-DEXTROAMPHETAMINE 20 MG PO TABS: 20.0000 mg | ORAL_TABLET | Freq: Two times a day (BID) | ORAL | Status: DC

## 2014-03-02 NOTE — Telephone Encounter (Signed)
pinted and signed

## 2014-03-04 NOTE — Telephone Encounter (Signed)
Notified pt on VM Rx ready. 

## 2014-06-10 ENCOUNTER — Other Ambulatory Visit: Payer: Self-pay

## 2014-06-10 MED ORDER — ZOLPIDEM TARTRATE 10 MG PO TABS: ORAL_TABLET | ORAL | Status: DC

## 2014-06-10 NOTE — Telephone Encounter (Signed)
Faxed

## 2014-06-10 NOTE — Telephone Encounter (Signed)
Pharm reqs Rf of ambien. Pended for review - 1 mos w/note pt needs OV for more (July since seen).

## 2014-06-21 ENCOUNTER — Encounter: Payer: Self-pay | Admitting: Podiatry

## 2014-06-21 ENCOUNTER — Telehealth: Payer: Self-pay

## 2014-06-21 ENCOUNTER — Ambulatory Visit (INDEPENDENT_AMBULATORY_CARE_PROVIDER_SITE_OTHER): Payer: BLUE CROSS/BLUE SHIELD | Admitting: Podiatry

## 2014-06-21 ENCOUNTER — Ambulatory Visit (INDEPENDENT_AMBULATORY_CARE_PROVIDER_SITE_OTHER): Payer: BLUE CROSS/BLUE SHIELD

## 2014-06-21 VITALS — BP 109/74 | HR 91 | Resp 18

## 2014-06-21 DIAGNOSIS — M79672 Pain in left foot: Secondary | ICD-10-CM

## 2014-06-21 DIAGNOSIS — M79671 Pain in right foot: Secondary | ICD-10-CM | POA: Diagnosis not present

## 2014-06-21 DIAGNOSIS — M205X9 Other deformities of toe(s) (acquired), unspecified foot: Secondary | ICD-10-CM | POA: Diagnosis not present

## 2014-06-21 NOTE — Telephone Encounter (Signed)
Pt in need of his ADDERALL 20mg s and a 3 months supply. Please call [phone]

## 2014-06-21 NOTE — Progress Notes (Signed)
   Subjective:    Patient ID: Stanley Miller, male    DOB: 05/08/1970, 44 y.o.   MRN: [id number]  HPI I SAW DR Thurston HoleWAINER FOR MY TWO BIG TOES AND HE DID X-RAYS AND DID TWO INJECTIONS ON THEM AND HAS BEEN GOING ON FOR ABOUT 2 YEARS AND THEY DO ACHE SOME AND I DO HAVE A FOOT ODOR    Review of Systems  All other systems reviewed and are negative.      Objective:   Physical Exam        Assessment & Plan:

## 2014-06-21 NOTE — Progress Notes (Signed)
Subjective:     Patient ID: Stanley Miller, male   DOB: 05/28/1970, 44 y.o.   MRN: [id number]  HPI patient presents stating I have very painful big toe joints that's been present for years and it's reaching a point where I could no longer tolerate them. States that he has had previous injections which give him several weeks of relief followed by a reoccurrence of the symptoms   Review of Systems  All other systems reviewed and are negative.      Objective:   Physical Exam  Constitutional: He is oriented to person, place, and time.  Cardiovascular: Intact distal pulses.   Musculoskeletal: Normal range of motion.  Neurological: He is oriented to person, place, and time.  Skin: Skin is warm.  Nursing note and vitals reviewed.  neurovascular status intact with muscle strength adequate and range of motion subtalar midtarsal joint within normal limits. Patient's noted to have severe loss of motion first MPJ bilateral with crepitus within the joint surface and pain when I pressed both medial dorsal and lateral around the first metatarsal head with obvious spurring and redness around the dorsal and medial side of the first metatarsal head. Patient's noted to have good digital perfusion is well oriented 3     Assessment:     Severe hallux rigidus deformity bilateral    Plan:     H&P and x-rays reviewed. Due to the length of symptoms and failure to respond to numerous conservative care I have recommended either fusion or joint implantation procedure. Patient wants to return to work as soon as possible and like to get them both done at the same time so I do believe he makes a very good joint implantation candidate. I did discuss this with him and reviewed pros and cons of this and he wants surgery and is scheduled April for surgery and will reappoint for consult in the next week or 2

## 2014-06-22 NOTE — Telephone Encounter (Signed)
Pt will need to rtc to be seen by Dr Merla Richesoolittle for further evaluation. He last saw her 8 months ago for her ADD, he likes to f/u at 3-6 months for refills.

## 2014-06-22 NOTE — Telephone Encounter (Signed)
Pt hasn't been seen since July.

## 2014-06-29 ENCOUNTER — Telehealth: Payer: Self-pay

## 2014-06-29 DIAGNOSIS — F900 Attention-deficit hyperactivity disorder, predominantly inattentive type: Secondary | ICD-10-CM

## 2014-06-29 NOTE — Telephone Encounter (Signed)
Pt was very upset that he says he did not receive call stating he had to return to clinic before getting aderall refill

## 2014-06-30 MED ORDER — AMPHETAMINE-DEXTROAMPHETAMINE 20 MG PO TABS: 20.0000 mg | ORAL_TABLET | Freq: Two times a day (BID) | ORAL | Status: DC

## 2014-06-30 NOTE — Telephone Encounter (Signed)
Can we give pt one more month until he can come in?

## 2014-06-30 NOTE — Telephone Encounter (Signed)
Meds ordered this encounter  Medications  . amphetamine-dextroamphetamine (ADDERALL) 20 MG tablet    Sig: Take 1 tablet (20 mg total) by mouth 2 (two) times daily.    Dispense:  60 tablet    Refill:  0    

## 2014-07-01 NOTE — Telephone Encounter (Signed)
Left message on machine to follow up before this Rx runs out. Advised Rx ready to pick up.

## 2014-07-01 NOTE — Telephone Encounter (Signed)
lmom to cb. 

## 2014-07-01 NOTE — Telephone Encounter (Signed)
Left detailed message on machine.

## 2014-07-04 ENCOUNTER — Telehealth: Payer: Self-pay | Admitting: *Deleted

## 2014-07-04 NOTE — Telephone Encounter (Signed)
Pt states he is scheduled for B/L 1st toe joint replacement. I left a message on voicemail informing pt - if his profession was sit down the 7 - 10 days and if his profession was a stand/walk he would be out of work 4 - 6 weeks.  I encouraged pt to call with questions.

## 2014-07-07 ENCOUNTER — Telehealth: Payer: Self-pay | Admitting: *Deleted

## 2014-07-07 NOTE — Telephone Encounter (Signed)
"  Surgery scheduled for 08/02/2014.  I want to reschedule it to 08/09/2014.  Give me a call.  You may have to leave a message."  "I have surgery scheduled for 08/02/2014 and I'd like to reschedule to 08/09/2014."  Okay, I'll take care of that for you.  I called and rescheduled with Aram [facility]ynthia at [facility].

## 2014-07-07 NOTE — Telephone Encounter (Signed)
Mr. Stanley Miller' new post-op 1 appointment is scheduled for Monday May 9 @ 8:45am.

## 2014-07-16 ENCOUNTER — Other Ambulatory Visit: Payer: Self-pay | Admitting: Internal Medicine

## 2014-07-18 NOTE — Telephone Encounter (Signed)
Faxed

## 2014-07-21 ENCOUNTER — Encounter: Payer: Self-pay | Admitting: Podiatry

## 2014-07-21 ENCOUNTER — Ambulatory Visit (INDEPENDENT_AMBULATORY_CARE_PROVIDER_SITE_OTHER): Payer: BLUE CROSS/BLUE SHIELD | Admitting: Podiatry

## 2014-07-21 VITALS — BP 117/81 | HR 75 | Resp 14

## 2014-07-21 DIAGNOSIS — M205X9 Other deformities of toe(s) (acquired), unspecified foot: Secondary | ICD-10-CM

## 2014-07-21 NOTE — Patient Instructions (Signed)
Pre-Operative Instructions  Congratulations, you have decided to take an important step to improving your quality of life.  You can be assured that the doctors of [facility] will be with you every step of the way.  1. Plan to be at the surgery center/hospital at least 1 (one) hour prior to your scheduled time unless otherwise directed by the surgical center/hospital staff.  You must have a responsible adult accompany you, remain during the surgery and drive you home.  Make sure you have directions to the surgical center/hospital and know how to get there on time. 2. For hospital based surgery you will need to obtain a history and physical form from your family physician within 1 month prior to the date of surgery- we will give you a form for you primary physician.  3. We make every effort to accommodate the date you request for surgery.  There are however, times where surgery dates or times have to be moved.  We will contact you as soon as possible if a change in schedule is required.   4. No Aspirin/Ibuprofen for one week before surgery.  If you are on aspirin, any non-steroidal anti-inflammatory medications (Mobic, Aleve, Ibuprofen) you should stop taking it 7 days prior to your surgery.  You make take Tylenol  For pain prior to surgery.  5. Medications- If you are taking daily heart and blood pressure medications, seizure, reflux, allergy, asthma, anxiety, pain or diabetes medications, make sure the surgery center/hospital is aware before the day of surgery so they may notify you which medications to take or avoid the day of surgery. 6. No food or drink after midnight the night before surgery unless directed otherwise by surgical center/hospital staff. 7. No alcoholic beverages 24 hours prior to surgery.  No smoking 24 hours prior to or 24 hours after surgery. 8. Wear loose pants or shorts- loose enough to fit over bandages, boots, and casts. 9. No slip on shoes, sneakers are best. 10. Bring  your boot with you to the surgery center/hospital.  Also bring crutches or a walker if your physician has prescribed it for you.  If you do not have this equipment, it will be provided for you after surgery. 11. If you have not been contracted by the surgery center/hospital by the day before your surgery, call to confirm the date and time of your surgery. 12. Leave-time from work may vary depending on the type of surgery you have.  Appropriate arrangements should be made prior to surgery with your employer. 13. Prescriptions will be provided immediately following surgery by your doctor.  Have these filled as soon as possible after surgery and take the medication as directed. 14. Remove nail polish on the operative foot. 15. Wash the night before surgery.  The night before surgery wash the foot and leg well with the antibacterial soap provided and water paying special attention to beneath the toenails and in between the toes.  Rinse thoroughly with water and dry well with a towel.  Perform this wash unless told not to do so by your physician.  Enclosed: 1 Ice pack (please put in freezer the night before surgery)   1 Hibiclens skin cleaner   Pre-op Instructions  If you have any questions regarding the instructions, do not hesitate to call our office.  [place]: [address] [place], [place] [address] [phone]  [place]: [address] [place], [place] [address] [phone]  [place]: [address]  , [place] [address] [phone]  Dr. Richard   Tuchman DPM, Dr. Norman Regal DPM Dr. Richard Sikora DPM, Dr. M. Todd Hyatt DPM, Dr. Kathryn Egerton DPM 

## 2014-07-22 NOTE — Progress Notes (Signed)
Subjective:     Patient ID: Stanley Miller, male   DOB: 10/18/1970, 44 y.o.   MRN: [id number]  HPI patient presents with severe arthritis of the first MPJ bilateral that he has not been able to do anything with and it's becoming increasingly hard to walk or wear shoe gear   Review of Systems     Objective:   Physical Exam Neurovascular status intact with no change in health history noted and patient's noted to have severe limitation of motion first MPJ bilateral with dorsal spurring redness and pain when pressed into the joint surface and the spurring itself    Assessment:     Severe hallux limitus rigidus deformity bilateral with no joint surface present on x-ray    Plan:     Reviewed condition at great length discussing options and patient wants surgery. I've recommended full silicone joint implantation explaining someday fusion may be necessary. He is willing to accept the risk of surgery and at this time I will let him read the consent form line byline going over all alternative treatments and complications. Patient is willing to accept all risk of surgery signed consent form and is given all preoperative instructions for surgery and is scheduled for outpatient procedures. He will call with any questions prior to procedures I also explained told recovery. Take 6 months to one year and there is no long-term guarantees the implant will hold up for the long-term

## 2014-08-02 ENCOUNTER — Ambulatory Visit (INDEPENDENT_AMBULATORY_CARE_PROVIDER_SITE_OTHER): Payer: BLUE CROSS/BLUE SHIELD | Admitting: Internal Medicine

## 2014-08-02 VITALS — BP 104/72 | HR 89 | Temp 98.2°F | Resp 18 | Ht 71.0 in | Wt 179.0 lb

## 2014-08-02 DIAGNOSIS — F32A Depression, unspecified: Secondary | ICD-10-CM

## 2014-08-02 DIAGNOSIS — F900 Attention-deficit hyperactivity disorder, predominantly inattentive type: Secondary | ICD-10-CM

## 2014-08-02 DIAGNOSIS — F329 Major depressive disorder, single episode, unspecified: Secondary | ICD-10-CM | POA: Diagnosis not present

## 2014-08-02 MED ORDER — TIZANIDINE HCL 4 MG PO TABS: 4.0000 mg | ORAL_TABLET | Freq: Every day | ORAL | Status: DC

## 2014-08-02 MED ORDER — ZOLPIDEM TARTRATE 10 MG PO TABS: 10.0000 mg | ORAL_TABLET | Freq: Every day | ORAL | Status: DC

## 2014-08-02 MED ORDER — BUPROPION HCL ER (XL) 300 MG PO TB24: 300.0000 mg | ORAL_TABLET | Freq: Every day | ORAL | Status: DC

## 2014-08-02 MED ORDER — AMPHETAMINE-DEXTROAMPHETAMINE 20 MG PO TABS: 20.0000 mg | ORAL_TABLET | Freq: Two times a day (BID) | ORAL | Status: DC
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
## 2014-08-05 ENCOUNTER — Telehealth: Payer: Self-pay | Admitting: *Deleted

## 2014-08-05 NOTE — Telephone Encounter (Signed)
[facility] is needed for patient's surgery scheduled for 08/09/2014.  "Authorization is not needed for [id number] or [id number]."  Can I get a reference number please?  "My name can be the reference number."

## 2014-08-08 ENCOUNTER — Telehealth: Payer: Self-pay | Admitting: *Deleted

## 2014-08-08 NOTE — Telephone Encounter (Signed)
"  I want to cancel my surgery scheduled for tomorrow."  Is there any particular reason you want to cancel?  "There's several reasons.  One is I feel like I'm rushed out of there like cattle.  I've been there twice and experienced the same thing.  I have questions about the surgery.  I guess I'll have to come back in there to have them answered.  I feel like I leave out of there dumb about what's going on.  I want time to be taken with me that I deserve."  I'm so sorry you've had this experience.  I will make sure I pass the information on.  "It's not your fault and I don't mean to come off like that."  Would you like me to send you to a scheduler to make an appointment?  "Sure that will be fine."  I called and canceled surgery [name].

## 2014-08-09 ENCOUNTER — Ambulatory Visit (INDEPENDENT_AMBULATORY_CARE_PROVIDER_SITE_OTHER): Payer: BLUE CROSS/BLUE SHIELD | Admitting: Internal Medicine

## 2014-08-09 VITALS — BP 108/72 | HR 94 | Temp 98.3°F | Resp 18 | Ht 71.0 in | Wt 179.0 lb

## 2014-08-09 DIAGNOSIS — H8110 Benign paroxysmal vertigo, unspecified ear: Secondary | ICD-10-CM | POA: Diagnosis not present

## 2014-08-09 NOTE — Progress Notes (Addendum)
   Subjective:    Patient ID: Stanley Miller, male    DOB: 01/22/1971, 44 y.o.   MRN: [id number]  This chart was scribed for Tonye Pearsonobert P Eriel Doyon, MD by Ronney LionSuzanne Le, ED Scribe. This patient was seen in room 5 and the patient's care was started at 4:24 PM.   HPI   Chief Complaint  Patient presents with  . Dizziness    x6 days      HPI Comments: Stanley Miller is a 44 y.o. male who presents to the [facility] complaining of intermittent episodes of room-spinning dizziness that has been ongoing for 6 days. Patient has these episodes of dizziness that last for 5-10 seconds whenever he lies down or when he bends over. Patient denies any trouble at work due to his symptoms. Patient has allergy symptoms, including sneezing, rhinorrhea, and/or eye itching. He denies vomiting, tachycardic palpitations, numbness, tingling, appetite change, or ear popping.  on adder for add See other meds=no chg  Review of Systems  Constitutional: Negative for appetite change.  HENT: Positive for rhinorrhea and sneezing.   Eyes: Positive for itching.  Cardiovascular: Negative for palpitations.  Gastrointestinal: Negative for vomiting.  Neurological: Positive for dizziness. Negative for numbness.       Objective:   Physical Exam  Constitutional: He is oriented to person, place, and time. He appears well-developed and well-nourished. No distress.  HENT:  Head: Normocephalic and atraumatic.  Right Ear: External ear normal.  Left Ear: External ear normal.  Mouth/Throat: Oropharynx is clear and moist.  Eyes: Conjunctivae are normal.  Blind R eye///L pupil w/out nystag, RRL  Neck: Neck supple. No thyromegaly present.  Cardiovascular: Normal rate, regular rhythm and normal heart sounds.   No murmur heard. Pulmonary/Chest: Effort normal and breath sounds normal.  Lymphadenopathy:    He has no cervical adenopathy.  Neurological: He is alert and oriented to person, place, and time. He has  normal reflexes. No cranial nerve deficit. He exhibits normal muscle tone. Coordination normal.  Gait wnl  Psychiatric: He has a normal mood and affect. His behavior is normal. Judgment and thought content normal.  Nursing note and vitals reviewed.  BP 108/72 mmHg  Pulse 94  Temp(Src) 98.3 F (36.8 C) (Oral)  Resp 18  Ht 5\' 11"  (1.803 m)  Wt 179 lb (81.194 kg)  BMI 24.98 kg/m2  SpO2 99%      Assessment & Plan:  BPV (benign positional vertigo), unspecified laterality  Austin MilesBrandt Daroff manuver as needed F/u 1 week if not resolved I have completed the patient encounter in its entirety as documented by the scribe, with editing by me where necessary. Ziyad Dyar P. Merla Richesoolittle, M.D.

## 2014-08-11 ENCOUNTER — Ambulatory Visit (INDEPENDENT_AMBULATORY_CARE_PROVIDER_SITE_OTHER): Payer: BLUE CROSS/BLUE SHIELD | Admitting: Podiatry

## 2014-08-11 ENCOUNTER — Encounter: Payer: Self-pay | Admitting: Podiatry

## 2014-08-11 VITALS — BP 108/74 | HR 77 | Resp 12

## 2014-08-11 DIAGNOSIS — M205X9 Other deformities of toe(s) (acquired), unspecified foot: Secondary | ICD-10-CM

## 2014-08-11 NOTE — Progress Notes (Signed)
Subjective:     Patient ID: Stanley Miller, male   DOB: 07/16/1970, 44 y.o.   MRN: [id number]  HPI patient presents to discuss the toe joint issues that he is experiencing   Review of Systems     Objective:   Physical Exam Neurovascular status intact with severe arthritis first MPJ bilateral with spur formation and inability to bend the joints with pain with any palpation or any type of movement    Assessment:     Severe hallux limitus rigidus condition of the first MPJ bilateral    Plan:     Reviewed all condition and discussion and considerations for treatment of this condition. Due to long-standing nature and the severity of pain I've recommended that this patient undergo implant arthroplasty even though I did discuss fusion and doing nothing is other alternatives. He understands again as we discussed at previous visit that there is no guarantee as to the success this procedure and that ultimately the implants may fail and need to be replaced or replaced with fusion. He understands total recovery period will take at least 6 months for this particular problem and is willing to accept that risk wants surgery and is scheduled in June for procedure

## 2014-09-13 ENCOUNTER — Encounter: Payer: Self-pay | Admitting: *Deleted

## 2014-09-13 DIAGNOSIS — M2042 Other hammer toe(s) (acquired), left foot: Secondary | ICD-10-CM | POA: Diagnosis not present

## 2014-09-13 DIAGNOSIS — M2021 Hallux rigidus, right foot: Secondary | ICD-10-CM | POA: Diagnosis not present

## 2014-09-13 DIAGNOSIS — M2022 Hallux rigidus, left foot: Secondary | ICD-10-CM | POA: Diagnosis not present

## 2014-09-20 ENCOUNTER — Telehealth: Payer: Self-pay | Admitting: *Deleted

## 2014-09-20 ENCOUNTER — Ambulatory Visit (INDEPENDENT_AMBULATORY_CARE_PROVIDER_SITE_OTHER): Payer: BLUE CROSS/BLUE SHIELD

## 2014-09-20 ENCOUNTER — Encounter: Payer: Self-pay | Admitting: Podiatry

## 2014-09-20 ENCOUNTER — Ambulatory Visit (INDEPENDENT_AMBULATORY_CARE_PROVIDER_SITE_OTHER): Payer: BLUE CROSS/BLUE SHIELD | Admitting: Podiatry

## 2014-09-20 VITALS — BP 116/76 | HR 84 | Resp 15

## 2014-09-20 DIAGNOSIS — Z9889 Other specified postprocedural states: Secondary | ICD-10-CM

## 2014-09-20 DIAGNOSIS — M205X9 Other deformities of toe(s) (acquired), unspecified foot: Secondary | ICD-10-CM

## 2014-09-20 NOTE — Telephone Encounter (Signed)
Patient was called to see how he was doing after surgery yesterday.  "I'm doing fine.  I'm following the instructions that were given."  Okay, that is great.  Give [place] a call if you have any questions or concerns.  Call on-call physician in the evening if you have any problems in the evening.

## 2014-09-20 NOTE — Progress Notes (Signed)
Patient had a Stanley Miller Implant surgery bilateral foot at [facility]. Rx:  Demerol 50 mg/cc quantity 40 Sig: 1 or 2 by mouth every 4-6 hours as needed for pain        Phenergan 25 mg/cc quantity 40 Sig: 1 or 2 by mouth every 4-6 hours as needed for pain.

## 2014-09-26 NOTE — Progress Notes (Signed)
Subjective:     Patient ID: Stanley Miller, male   DOB: Oct 17, 1970, 44 y.o.   MRN: [id number]  HPI patient states his feet are feeling good with minimal swelling or pain and he can already feel that the discomfort of the joints are better and that the motion is better   Review of Systems     Objective:   Physical Exam Neurovascular status intact muscle strength adequate with excellent range of motion first MPJ bilateral with incision sites that are well coapted and negative Homans sign noted    Assessment:     Doing well post implant procedure first metatarsal phalangeal joint right and tenotomy second digit left    Plan:     Reviewed x-rays with patient and at this time applied sterile dressing and continue immobilization. Reappoint to recheck in 2 weeks

## 2014-10-05 ENCOUNTER — Ambulatory Visit (INDEPENDENT_AMBULATORY_CARE_PROVIDER_SITE_OTHER): Payer: BLUE CROSS/BLUE SHIELD | Admitting: Podiatry

## 2014-10-05 ENCOUNTER — Ambulatory Visit (INDEPENDENT_AMBULATORY_CARE_PROVIDER_SITE_OTHER): Payer: BLUE CROSS/BLUE SHIELD

## 2014-10-05 ENCOUNTER — Encounter: Payer: Self-pay | Admitting: Podiatry

## 2014-10-05 VITALS — BP 121/89 | HR 67 | Temp 98.7°F | Resp 12

## 2014-10-05 DIAGNOSIS — T814XXA Infection following a procedure, initial encounter: Secondary | ICD-10-CM

## 2014-10-05 DIAGNOSIS — M79672 Pain in left foot: Secondary | ICD-10-CM

## 2014-10-05 DIAGNOSIS — L03119 Cellulitis of unspecified part of limb: Secondary | ICD-10-CM

## 2014-10-05 DIAGNOSIS — T8140XA Infection following a procedure, unspecified, initial encounter: Secondary | ICD-10-CM

## 2014-10-05 MED ORDER — CEPHALEXIN 500 MG PO CAPS: 500.0000 mg | ORAL_CAPSULE | Freq: Two times a day (BID) | ORAL | Status: DC

## 2014-10-05 NOTE — Progress Notes (Signed)
   Subjective:    Patient ID: Stanley RayaRoger K Harju, male    DOB: 08/10/1970, 44 y.o.   MRN: [id number]  HPI Patient started getting drainage at site of operation over last couple days.   Review of Systems     Objective:   Physical Exam        Assessment & Plan:

## 2014-10-06 NOTE — Progress Notes (Signed)
Subjective:     Patient ID: Stanley Miller, male   DOB: 06/11/1970, 44 y.o.   MRN: [id number]  HPI patient presents stating I was concerned about some redness on my foot and I have had some increased pain and I just wanted to make sure everything was okay   Review of Systems     Objective:   Physical Exam Neurovascular status is intact with wound edges well coapted and is noted on the left incision site distal there is some distal irritation which appears to be more related to the suture then to any other condition. I noted no proximal edema erythema drainage in the motion of the joint bilateral is excellent with 30 of dorsiflexion 20 plantar flexion with no pain or crepitus in the joint    Assessment:     Inflammatory changes and cannot rule out distal localized infective dehiscence of the left incision site    Plan:     Precautionary x-rays reviewed with patient and explained to him soaks and compression elevation also at this time. I did go ahead as precautionary measure placed him on cephalexin 500 mg 3 times a day and I gave him strict instructions of any proximal edema erythema or drainage were to occur increased pain or other issues he is to let us know immediately. He will be checked back in several weeks and will be seen earlier if there's any issues at all

## 2014-10-11 ENCOUNTER — Ambulatory Visit: Payer: BLUE CROSS/BLUE SHIELD | Admitting: Podiatry

## 2014-10-13 ENCOUNTER — Telehealth: Payer: Self-pay | Admitting: *Deleted

## 2014-10-13 NOTE — Telephone Encounter (Signed)
Pt asked for fax to our office for return to work form.  I gave the pt [phone] and Algis GreenhouseJanet Albert as the contact.

## 2014-10-17 ENCOUNTER — Ambulatory Visit (INDEPENDENT_AMBULATORY_CARE_PROVIDER_SITE_OTHER): Payer: BLUE CROSS/BLUE SHIELD | Admitting: Podiatry

## 2014-10-17 ENCOUNTER — Ambulatory Visit (INDEPENDENT_AMBULATORY_CARE_PROVIDER_SITE_OTHER): Payer: BLUE CROSS/BLUE SHIELD

## 2014-10-17 VITALS — BP 127/95 | HR 80 | Resp 15

## 2014-10-17 DIAGNOSIS — Z9889 Other specified postprocedural states: Secondary | ICD-10-CM

## 2014-10-17 DIAGNOSIS — M205X9 Other deformities of toe(s) (acquired), unspecified foot: Secondary | ICD-10-CM

## 2014-10-18 NOTE — Progress Notes (Signed)
Subjective:     Patient ID: Stanley Miller, male   DOB: 11/27/1970, 44 y.o.   MRN: [id number]  HPI patient states I'm doing really well and able to walk with minimal discomfort   Review of Systems     Objective:   Physical Exam Neurovascular status intact negative Homans sign noted with excellent range of motion first MPJ both feet with no pain or crepitus in the joint    Assessment:     Doing well post total implant arthroplasty bilateral first metatarsal phalangeal joint    Plan:     Reviewed x-rays and allow patient to return to normal activity. Explain swelling will still occur and reappoint in 8 weeks for evaluation

## 2014-10-19 ENCOUNTER — Telehealth: Payer: Self-pay | Admitting: *Deleted

## 2014-10-19 NOTE — Telephone Encounter (Signed)
"  Had disability form completed, name and date of birth not on forms.  Question number 9 had a cross out there was no error of initials.  Please re-submit and send it to attention of Lewayne BuntingLoretta Myers.  Fax number is [phone]."    Forms were corrected and faxed to Lewayne BuntingLoretta Myers.

## 2014-11-06 IMAGING — US US SCROTUM
1 series · 14 of 25 positions shown · non-contrast
Comparison: 08/18/2006

CLINICAL DATA: Left testicle pain for years. Prior surgery for
varicocele.

EXAM:
SCROTAL ULTRASOUND
DOPPLER ULTRASOUND OF THE TESTICLES
TECHNIQUE: Complete ultrasound examination of the testicles, epididymis, and
other scrotal structures was performed. Color and spectral Doppler
ultrasound were also utilized to evaluate blood flow to the
testicles.

[Series 1: us scrotum · 0.07mm/px · 14 of 77 slices shown]
[im 1/77]
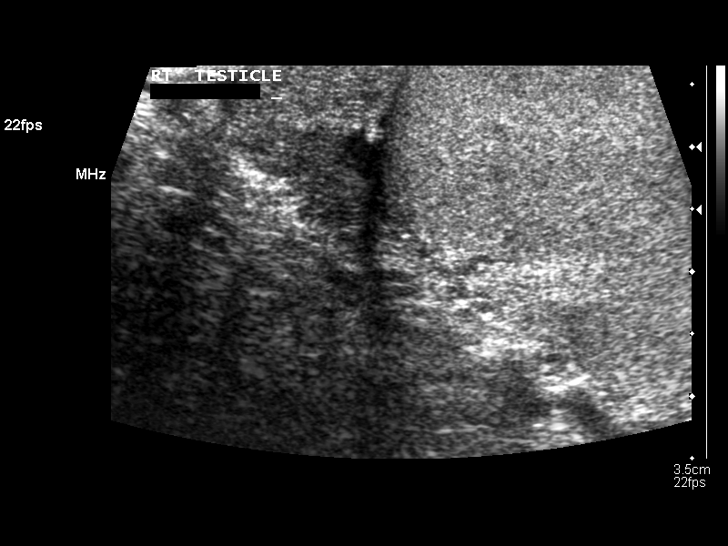
[im 7/77]
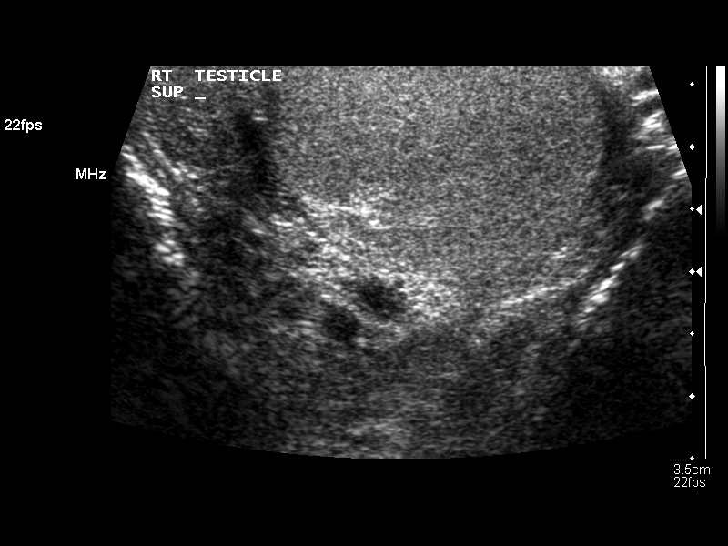
[im 13/77]
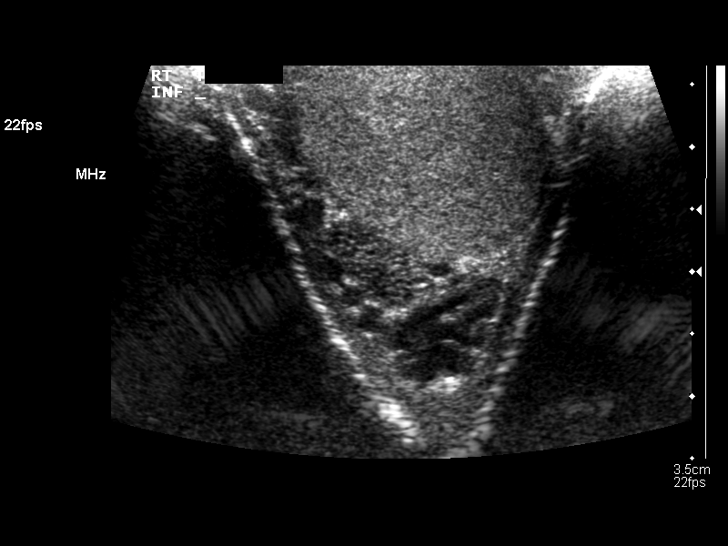
[im 20/77]
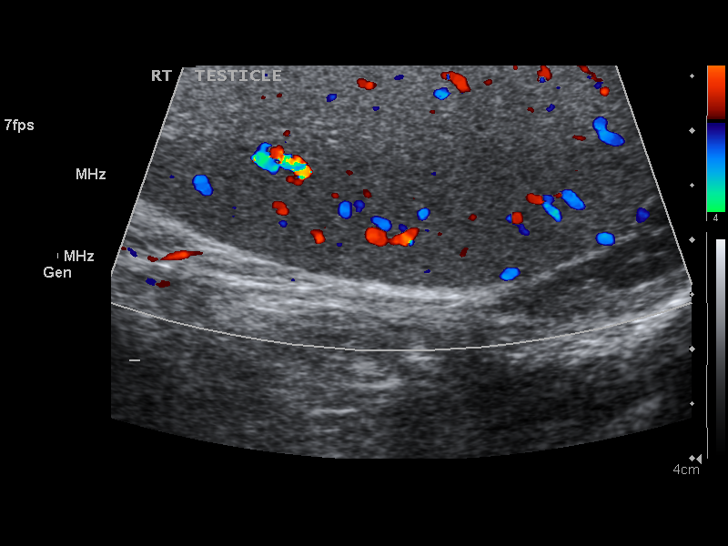
[im 26/77]
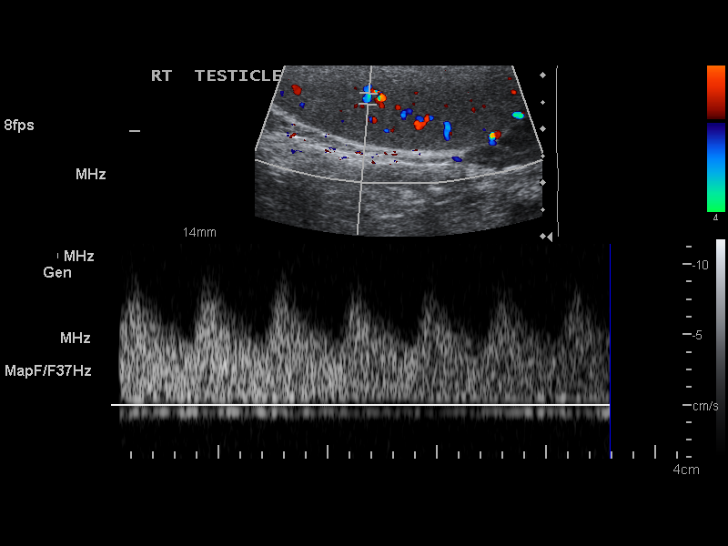
[im 29/77]
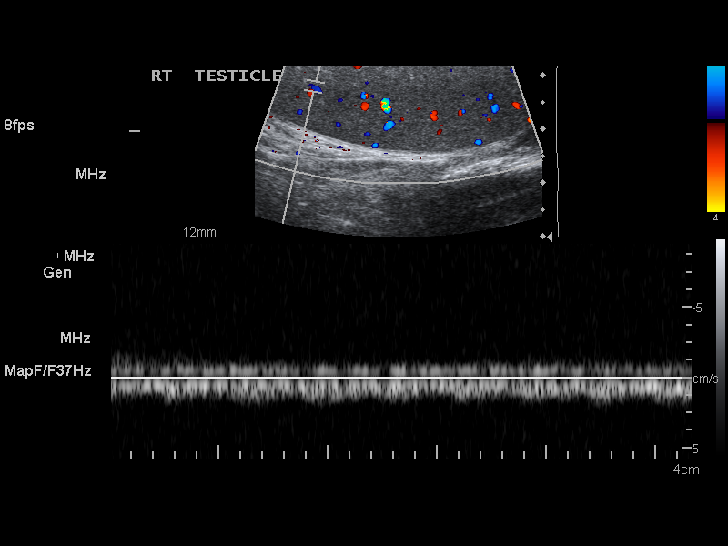
[im 35/77]
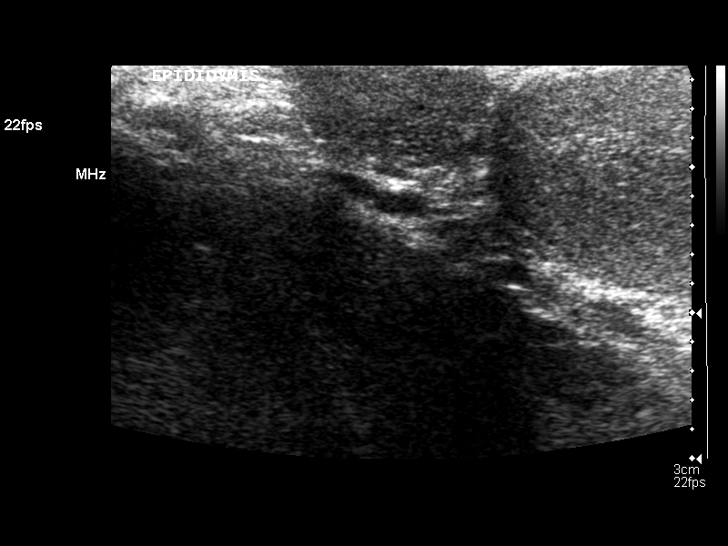
[im 42/77]
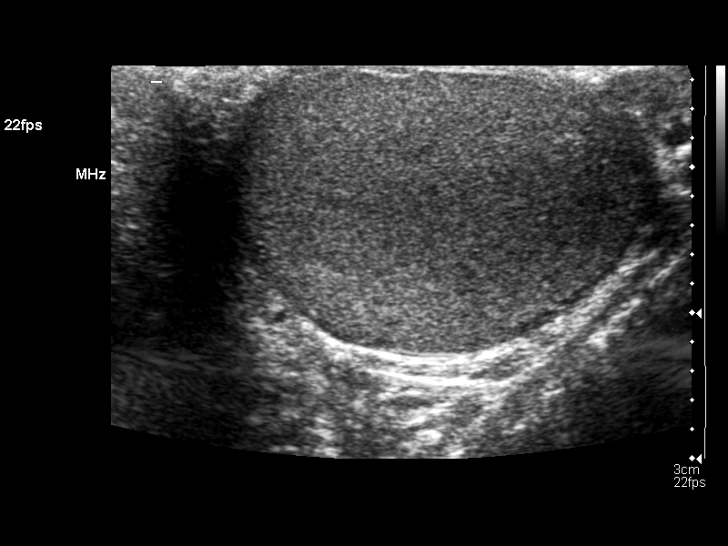
[im 48/77]
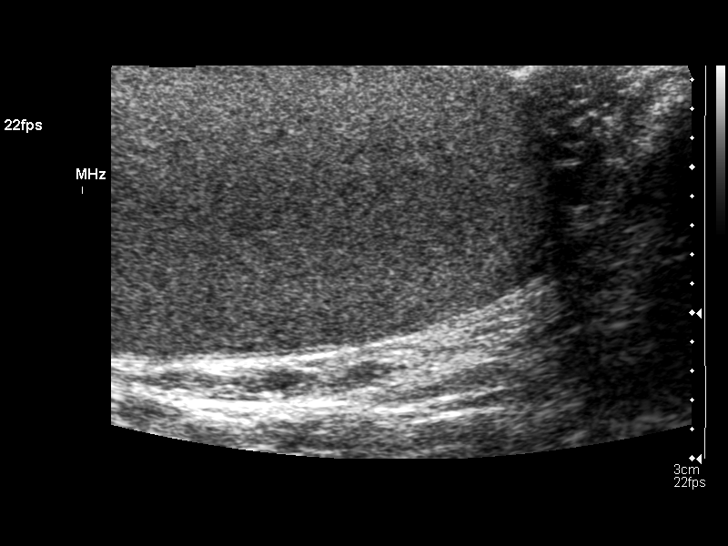
[im 51/77]
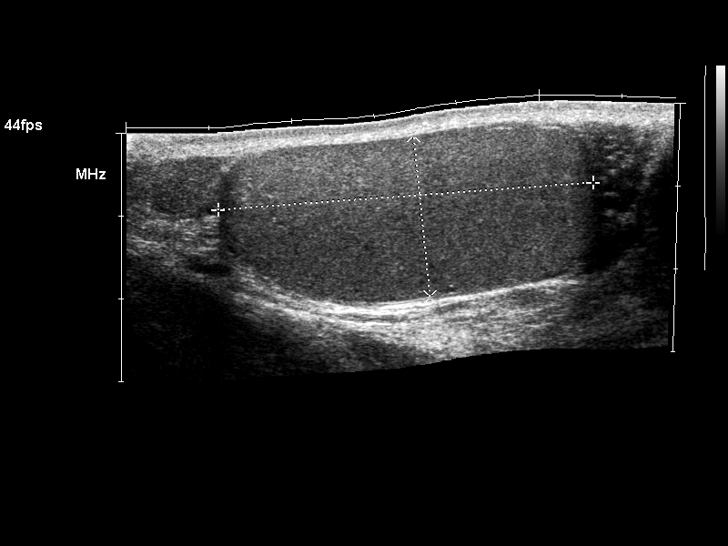
[im 58/77]
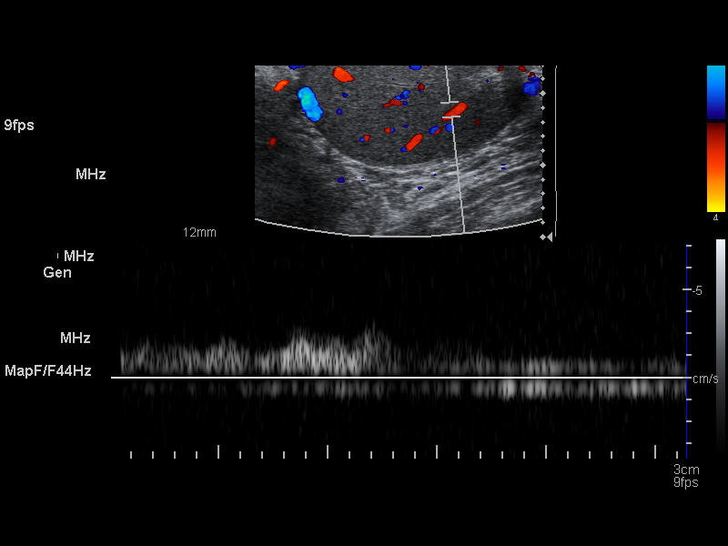
[im 64/77]
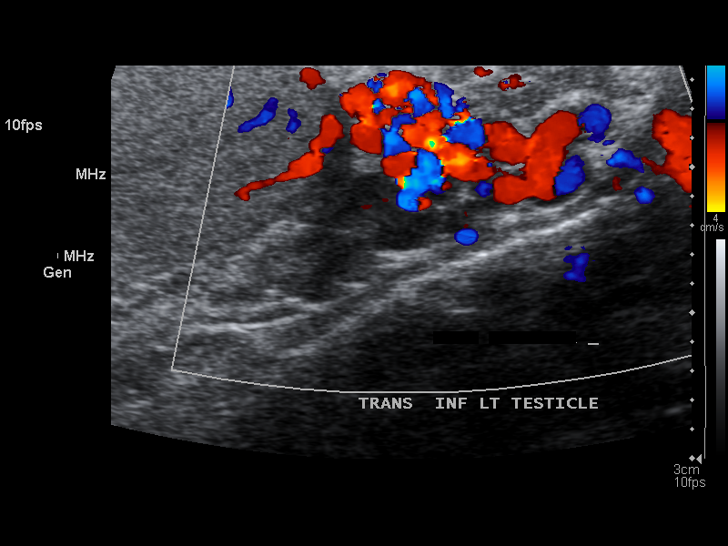
[im 70/77]
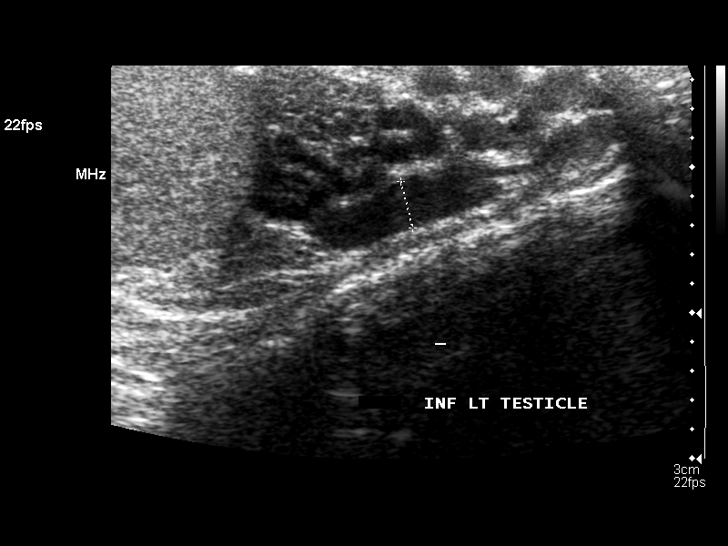
[im 77/77]
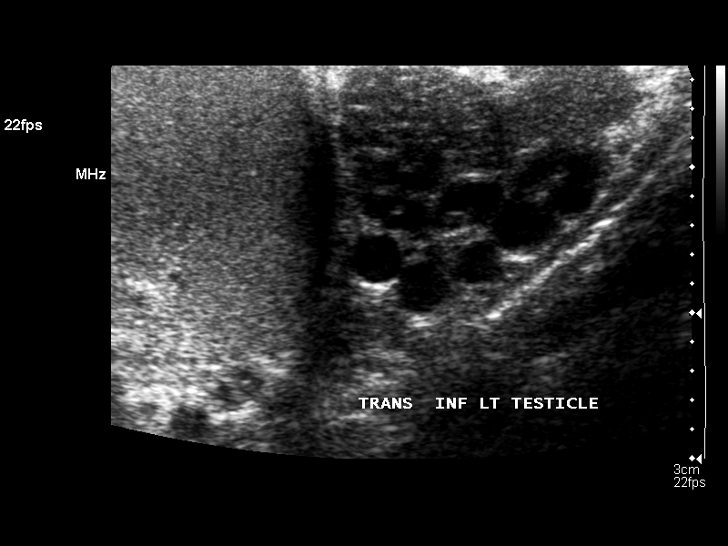

[14 of 25 positions shown; findings below may reference images not displayed]

FINDINGS: Right testicle

Measurements: 5.3 x 2.3 x 3.2 cm. No mass or microlithiasis
visualized.

Left testicle

Measurements: 4.5 x 2.0 x 3.2 cm. No mass or microlithiasis
visualized.

Right epididymis:  Normal in size and appearance.

Left epididymis:  Normal in size and appearance.

Hydrocele:  None visualized.

Varicocele: Prominent vessels were again seen inferior to the left
testicle, measuring up to 3.3 mm in diameter and grossly similar in
appearance to the prior ultrasound. No evidence of a right-sided
varicocele.

Pulsed Doppler interrogation of both testes demonstrates low
resistance arterial and venous waveforms bilaterally.
IMPRESSION: 1. Unremarkable appearance of the testes.
2. Similar appearance mildly dilated vessels inferior to the left
testicle.

## 2014-11-16 ENCOUNTER — Telehealth: Payer: Self-pay

## 2014-11-16 DIAGNOSIS — F900 Attention-deficit hyperactivity disorder, predominantly inattentive type: Secondary | ICD-10-CM

## 2014-11-16 NOTE — Telephone Encounter (Signed)
Pt would like to know if the Dr would write him a 3 month prescription for his ADDERALL 20 MG. Please call [phone]

## 2014-11-17 MED ORDER — AMPHETAMINE-DEXTROAMPHETAMINE 20 MG PO TABS: 20.0000 mg | ORAL_TABLET | Freq: Two times a day (BID) | ORAL | Status: DC

## 2014-11-17 NOTE — Telephone Encounter (Signed)
Ok Meds ordered this encounter  Medications  . amphetamine-dextroamphetamine (ADDERALL) 20 MG tablet    Sig: Take 1 tablet (20 mg total) by mouth 2 (two) times daily.    Dispense:  60 tablet    Refill:  0  . amphetamine-dextroamphetamine (ADDERALL) 20 MG tablet    Sig: Take 1 tablet (20 mg total) by mouth 2 (two) times daily. For 60 d after date signed    Dispense:  60 tablet    Refill:  0  . amphetamine-dextroamphetamine (ADDERALL) 20 MG tablet    Sig: Take 1 tablet (20 mg total) by mouth 2 (two) times daily. For 30 d after date signed    Dispense:  60 tablet    Refill:  0

## 2014-11-18 NOTE — Telephone Encounter (Signed)
Left message Rx's ready to pick up. 

## 2014-12-13 ENCOUNTER — Encounter: Payer: Self-pay | Admitting: Podiatry

## 2014-12-13 ENCOUNTER — Ambulatory Visit (INDEPENDENT_AMBULATORY_CARE_PROVIDER_SITE_OTHER): Payer: BLUE CROSS/BLUE SHIELD | Admitting: Podiatry

## 2014-12-13 ENCOUNTER — Ambulatory Visit (INDEPENDENT_AMBULATORY_CARE_PROVIDER_SITE_OTHER): Payer: BLUE CROSS/BLUE SHIELD

## 2014-12-13 DIAGNOSIS — M205X9 Other deformities of toe(s) (acquired), unspecified foot: Secondary | ICD-10-CM

## 2014-12-13 DIAGNOSIS — M779 Enthesopathy, unspecified: Secondary | ICD-10-CM

## 2014-12-13 DIAGNOSIS — Z9889 Other specified postprocedural states: Secondary | ICD-10-CM

## 2014-12-14 NOTE — Progress Notes (Signed)
Subjective:     Patient ID: Stanley Miller, male   DOB: May 05, 1970, 44 y.o.   MRN: [id number]  HPI patient presents stating I am doing great with my big toe joint but I'm getting pain at times under my feet because I think that I'm walking differently   Review of Systems     Objective:   Physical Exam Neurovascular status intact muscle strength adequate with well-healing surgical sites right and left first metatarsal with no pain in the joints with bending and noted to have mild to moderate inflammation in the second metatarsophalangeal joint of both feet with moderate lifting of the toe second left over right    Assessment:     Inflammatory capsulitis of the lesser MPJ secondary to gait change from surgery with excellent correction of the big toe joint with implant procedures    Plan:     H&P and x-rays reviewed with patient. At this time scanned for an accommodative type orthotic to reduce stress against the second metatarsal and reduce the pressure the patient is experiencing. Patient had a Berkley type device scanned at this time and will be seen back when it is ready

## 2014-12-15 ENCOUNTER — Ambulatory Visit (INDEPENDENT_AMBULATORY_CARE_PROVIDER_SITE_OTHER): Payer: BLUE CROSS/BLUE SHIELD | Admitting: Family Medicine

## 2014-12-15 ENCOUNTER — Ambulatory Visit (INDEPENDENT_AMBULATORY_CARE_PROVIDER_SITE_OTHER): Payer: BLUE CROSS/BLUE SHIELD

## 2014-12-15 VITALS — BP 126/68 | HR 86 | Temp 98.2°F | Resp 18 | Ht 71.0 in | Wt 182.0 lb

## 2014-12-15 DIAGNOSIS — R0789 Other chest pain: Secondary | ICD-10-CM | POA: Diagnosis not present

## 2014-12-15 LAB — POCT CBC
Granulocyte percent: 70.4 %G (ref 37–80)
HCT, POC: 43.1 % — AB (ref 43.5–53.7)
Hemoglobin: 13.9 g/dL — AB (ref 14.1–18.1)
Lymph, poc: 1.7 (ref 0.6–3.4)
MCH, POC: 28.1 pg (ref 27–31.2)
MCHC: 32.1 g/dL (ref 31.8–35.4)
MCV: 87.3 fL (ref 80–97)
MID (cbc): 0.6 (ref 0–0.9)
MPV: 6 fL (ref 0–99.8)
POC Granulocyte: 5.6 (ref 2–6.9)
POC LYMPH PERCENT: 21.7 %L (ref 10–50)
POC MID %: 7.9 %M (ref 0–12)
Platelet Count, POC: 310 10*3/uL (ref 142–424)
RBC: 4.94 M/uL (ref 4.69–6.13)
RDW, POC: 13.5 %
WBC: 8 10*3/uL (ref 4.6–10.2)

## 2014-12-15 LAB — D-DIMER, QUANTITATIVE: D-Dimer, Quant: 0.47 ug/mL-FEU (ref 0.00–0.48)

## 2014-12-15 MED ORDER — HYDROCODONE-ACETAMINOPHEN 5-325 MG PO TABS: 1.0000 | ORAL_TABLET | Freq: Every evening | ORAL | Status: DC | PRN

## 2014-12-15 MED ORDER — MELOXICAM 7.5 MG PO TABS: 7.5000 mg | ORAL_TABLET | Freq: Every day | ORAL | Status: DC

## 2014-12-15 NOTE — Patient Instructions (Signed)
We are running a special blood test to rule out a clot in the lung. This test usually takes 3 or 4 hours. We should call you back tonight.  In the meantime I have written some medicine to try to ease this irritation. It may be that you have a tendinitis in your chest.  The EKG is normal and there is no sign of pneumonia on the chest x-ray.

## 2014-12-15 NOTE — Progress Notes (Addendum)
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 He presents today with left pleuritic chest pain which began 2 days ago quite abruptly while he was at work. He does not remember any significant trauma at the time. He's not had this problem in the past. He has to take shallow breaths but is not particular short of breath. He's had a bit of a cough which is painful. He's had a slight sore throat.  Patient's had no fever, no leg pain or swelling. He also denies nausea, vomiting, neck pain, diaphoresis.  Family history is significant for his father having coronary disease in his father having diabetes.  After returning to review lab tests and x-ray with the patient, he reflected that he has had toe surgery 2 months ago and more recently has had some left knee swelling which is not very tender, but it does represent a change in his normal health situation.  Objective: Patient's in no acute distress and is a good historian BP 126/68 mmHg  Pulse 86  Temp(Src) 98.2 F (36.8 C)  Resp 18  Ht  (1.803 m)  Wt 182 lb (82.555 kg)  BMI 25.40 kg/m2  SpO2 99% HEENT: Unremarkable Neck: Supple no adenopathy, no thyromegaly Chest: Clear but tender in the left anterior lateral aspect of his chest Heart: Regular, no rub or gallop Abdomen: Soft and nontender Extremities: Nontender with no edema Neurologically: Patient's alert, cooperative, moving all 4 extremities without problem, and has normal cranial nerves III through XII  [facility] reading (PRIMARY) by  Dr. Milus Glazier:  Negative chest film Results for orders placed or performed in visit on 12/15/14  POCT CBC  Result Value Ref Range   WBC 8.0 4.6 - 10.2 K/uL   Lymph, poc 1.7 0.6 - 3.4   POC LYMPH PERCENT 21.7 10 - 50 %L   MID (cbc) 0.6 0 - 0.9   POC MID % 7.9 0 - 12 %M   POC Granulocyte 5.6 2 - 6.9   Granulocyte percent 70.4 37 - 80 %G   RBC 4.94 4.69 - 6.13 M/uL   Hemoglobin 13.9 (A) 14.1 - 18.1 g/dL   HCT,  POC 16.1 (A) 09.6 - 53.7 %   MCV 87.3 80 - 97 fL   MCH, POC 28.1 27 - 31.2 pg   MCHC 32.1 31.8 - 35.4 g/dL   RDW, POC 04.5 %   Platelet Count, POC 310 142 - 424 K/uL   MPV 6.0 0 - 99.8 fL   EKG shows no acute changes with a normal sinus rhythm . Assessment: Nondiagnostic history and physical exam. There may be some intercostal muscle strain or nerve root irritation that is responsible for this. Nevertheless, a d-dimer will be sent to rule out a clot.  This chart was scribed in my presence and reviewed by me personally.    ICD-9-CM ICD-10-CM   1. Other chest pain 786.59 R07.89 DG Chest 2 View     POCT CBC     D-dimer, quantitative (not at [facility])     EKG 12-Lead     meloxicam (MOBIC) 7.5 MG tablet     HYDROcodone-acetaminophen (NORCO) 5-325 MG per tablet     Signed, Elvina Sidle, MD

## 2014-12-19 ENCOUNTER — Ambulatory Visit (INDEPENDENT_AMBULATORY_CARE_PROVIDER_SITE_OTHER): Payer: BLUE CROSS/BLUE SHIELD | Admitting: Internal Medicine

## 2014-12-19 VITALS — BP 108/70 | HR 76 | Temp 98.5°F | Resp 12 | Ht 71.0 in | Wt 181.5 lb

## 2014-12-19 DIAGNOSIS — F32A Depression, unspecified: Secondary | ICD-10-CM

## 2014-12-19 DIAGNOSIS — F329 Major depressive disorder, single episode, unspecified: Secondary | ICD-10-CM

## 2014-12-19 DIAGNOSIS — R0789 Other chest pain: Secondary | ICD-10-CM | POA: Diagnosis not present

## 2014-12-19 DIAGNOSIS — M545 Low back pain: Secondary | ICD-10-CM

## 2014-12-19 DIAGNOSIS — F900 Attention-deficit hyperactivity disorder, predominantly inattentive type: Secondary | ICD-10-CM | POA: Diagnosis not present

## 2014-12-19 DIAGNOSIS — G4700 Insomnia, unspecified: Secondary | ICD-10-CM | POA: Diagnosis not present

## 2014-12-19 DIAGNOSIS — M542 Cervicalgia: Secondary | ICD-10-CM | POA: Diagnosis not present

## 2014-12-19 MED ORDER — BUPROPION HCL ER (XL) 300 MG PO TB24: 300.0000 mg | ORAL_TABLET | Freq: Every day | ORAL | Status: DC

## 2014-12-19 MED ORDER — AMPHETAMINE-DEXTROAMPHETAMINE 20 MG PO TABS: 20.0000 mg | ORAL_TABLET | Freq: Two times a day (BID) | ORAL | Status: DC

## 2014-12-19 MED ORDER — TIZANIDINE HCL 4 MG PO TABS: 4.0000 mg | ORAL_TABLET | Freq: Every day | ORAL | Status: DC

## 2014-12-19 MED ORDER — ZOLPIDEM TARTRATE 10 MG PO TABS: 10.0000 mg | ORAL_TABLET | Freq: Every day | ORAL | Status: DC

## 2014-12-19 NOTE — Progress Notes (Signed)
Subjective:    Patient ID: Stanley Miller, male    DOB: [birth year], 44 y.o.   MRN: [id number] This chart was scribed for Ellamae Sia, MD by Jolene Provost, Medical Scribe. This patient was seen in Room 11 and the patient's care was started a 6:06 PM.  Chief Complaint  Patient presents with  . Follow-up    Chest Pain-seen on 12/15/2014 with [place]  . Medication Refill    HPI HPI Comments: Stanley Miller is a 44 y.o. male who presents to [facility] reporting for a follow up due to chest pain on 12/15/2014. He states the pain has improved. Pt has a family hx of heart disease (father), but no hx with his siblings.   He also is here for medication refill for attention deficit disorder, depression and anxiety and insomnia, and chronic back and neck pain. His symptoms are relatively stable or at least manageable with  current medications He has been reading about yoga but has not started it.   Review of Systems  Constitutional: Negative for fever and chills.  Cardiovascular: Positive for chest pain (Resolving).       Objective:   Physical Exam  Constitutional: He is oriented to person, place, and time. He appears well-developed and well-nourished. No distress.  HENT:  Head: Normocephalic and atraumatic.  Eyes: Pupils are equal, round, and reactive to light.  Neck: Neck supple.  Cardiovascular: Normal rate.   Pulmonary/Chest: Effort normal. No respiratory distress.  Musculoskeletal: Normal range of motion.  Neurological: He is alert and oriented to person, place, and time. Coordination normal.  Skin: Skin is warm and dry. He is not diaphoretic.  Psychiatric: He has a normal mood and affect. His behavior is normal.  Nursing note and vitals reviewed.   Filed Vitals:   12/19/14 1754  BP: 108/70  Pulse: 76  Temp: 98.5 F (36.9 C)  TempSrc: Oral  Resp: 12  Height: 5\' 11"  (1.803 m)  Weight: 181 lb 8 oz (82.328 kg)  SpO2: 98%       Assessment & Plan:    Attention  deficit hyperactivity disorder (ADHD), predominantly inattentive type - Plan: amphetamine-dextroamphetamine (ADDERALL) 20 MG tablet, amphetamine-dextroamphetamine (ADDERALL) 20 MG tablet, amphetamine-dextroamphetamine (ADDERALL) 20 MG tablet  Depression - Plan: buPROPion (WELLBUTRIN XL) 300 MG 24 hr tablet  Other chest pain--this was musculoskeletal and is now almost completely resolved  Insomnia-Ambien  Neck pain-had another long discussion about yoga  Low back pain without sciatica, unspecified back pain laterality--yoga!  Meds ordered this encounter  Medications  . tiZANidine (ZANAFLEX) 4 MG tablet    Sig: Take 1 tablet (4 mg total) by mouth at bedtime.    Dispense:  30 tablet    Refill:  5  . zolpidem (AMBIEN) 10 MG tablet    Sig: Take 1 tablet (10 mg total) by mouth at bedtime.    Dispense:  30 tablet    Refill:  5  . buPROPion (WELLBUTRIN XL) 300 MG 24 hr tablet    Sig: Take 1 tablet (300 mg total) by mouth daily.    Dispense:  90 tablet    Refill:  1  . amphetamine-dextroamphetamine (ADDERALL) 20 MG tablet    Sig: Take 1 tablet (20 mg total) by mouth 2 (two) times daily. For 30 d after date signed    Dispense:  60 tablet    Refill:  0  . amphetamine-dextroamphetamine (ADDERALL) 20 MG tablet    Sig: Take 1 tablet (20 mg total) by mouth 2 (  two) times daily. For 60 d after date signed    Dispense:  60 tablet    Refill:  0  . amphetamine-dextroamphetamine (ADDERALL) 20 MG tablet    Sig: Take 1 tablet (20 mg total) by mouth 2 (two) times daily.    Dispense:  60 tablet    Refill:  0   I have completed the patient encounter in its entirety as documented by the scribe, with editing by me where necessary. Navjot Loera P. Merla Riches, M.D.

## 2015-02-15 ENCOUNTER — Encounter: Payer: Self-pay | Admitting: Podiatry

## 2015-02-15 ENCOUNTER — Ambulatory Visit (INDEPENDENT_AMBULATORY_CARE_PROVIDER_SITE_OTHER): Payer: BLUE CROSS/BLUE SHIELD | Admitting: Podiatry

## 2015-02-15 ENCOUNTER — Ambulatory Visit (INDEPENDENT_AMBULATORY_CARE_PROVIDER_SITE_OTHER): Payer: BLUE CROSS/BLUE SHIELD

## 2015-02-15 DIAGNOSIS — M2041 Other hammer toe(s) (acquired), right foot: Secondary | ICD-10-CM | POA: Diagnosis not present

## 2015-02-15 DIAGNOSIS — Z9889 Other specified postprocedural states: Secondary | ICD-10-CM

## 2015-02-15 DIAGNOSIS — M2042 Other hammer toe(s) (acquired), left foot: Secondary | ICD-10-CM

## 2015-02-15 NOTE — Patient Instructions (Signed)

## 2015-02-16 NOTE — Progress Notes (Signed)
Subjective:     Patient ID: Stanley Miller, male   DOB: 03/28/1971, 44 y.o.   MRN: [id number]  HPI patient states I'm doing really well just concerned about the second toe on my left foot being elevated   Review of Systems     Objective:   Physical Exam Neurovascular status intact well-healed surgical sites first MPJ bilateral with good range of motion of approximately 30 dorsiflexion 20 plantar flexion with no pain no crepitus noted of the joint surface I'll elevation of the second toe left foot noted    Assessment:     Doing well post implant arthroplasty bilateral with digital hammertoe deformity second left that's mildly intense in its discomfort at this time    Plan:     Evaluated condition and recommended that we watch the toe and that ultimately it may require surgical intervention but at this time he will just use shoe gear that's appropriate. Reappoint to recheck

## 2015-02-26 ENCOUNTER — Telehealth: Payer: Self-pay

## 2015-02-26 DIAGNOSIS — F900 Attention-deficit hyperactivity disorder, predominantly inattentive type: Secondary | ICD-10-CM

## 2015-02-26 NOTE — Telephone Encounter (Signed)
Pt. Request Aderall Refill please call when ready for pick up   [phone]

## 2015-02-27 MED ORDER — AMPHETAMINE-DEXTROAMPHETAMINE 20 MG PO TABS: 20.0000 mg | ORAL_TABLET | Freq: Two times a day (BID) | ORAL | Status: DC

## 2015-02-27 NOTE — Telephone Encounter (Signed)
Meds ordered this encounter  Medications  . amphetamine-dextroamphetamine (ADDERALL) 20 MG tablet    Sig: Take 1 tablet (20 mg total) by mouth 2 (two) times daily. For 05/19/15 or after    Dispense:  60 tablet    Refill:  0  . amphetamine-dextroamphetamine (ADDERALL) 20 MG tablet    Sig: Take 1 tablet (20 mg total) by mouth 2 (two) times daily. For 04/18/15 or after    Dispense:  60 tablet    Refill:  0  . amphetamine-dextroamphetamine (ADDERALL) 20 MG tablet    Sig: Take 1 tablet (20 mg total) by mouth 2 (two) times daily. For 03/18/15 or after    Dispense:  60 tablet    Refill:  0

## 2015-03-01 NOTE — Telephone Encounter (Signed)
Notified pt on VM Rxs are ready.

## 2015-03-06 ENCOUNTER — Encounter: Payer: Self-pay | Admitting: Internal Medicine

## 2015-07-10 ENCOUNTER — Other Ambulatory Visit: Payer: Self-pay | Admitting: Internal Medicine

## 2015-07-11 NOTE — Telephone Encounter (Signed)
Called in.

## 2015-07-15 ENCOUNTER — Ambulatory Visit (INDEPENDENT_AMBULATORY_CARE_PROVIDER_SITE_OTHER): Payer: BLUE CROSS/BLUE SHIELD | Admitting: Internal Medicine

## 2015-07-15 VITALS — BP 98/70 | HR 100 | Temp 98.2°F | Resp 14 | Ht 71.0 in | Wt 174.0 lb

## 2015-07-15 DIAGNOSIS — F900 Attention-deficit hyperactivity disorder, predominantly inattentive type: Secondary | ICD-10-CM | POA: Diagnosis not present

## 2015-07-15 DIAGNOSIS — M542 Cervicalgia: Secondary | ICD-10-CM

## 2015-07-15 DIAGNOSIS — F32A Depression, unspecified: Secondary | ICD-10-CM

## 2015-07-15 DIAGNOSIS — F329 Major depressive disorder, single episode, unspecified: Secondary | ICD-10-CM | POA: Diagnosis not present

## 2015-07-15 MED ORDER — AMPHETAMINE-DEXTROAMPHETAMINE 20 MG PO TABS: 20.0000 mg | ORAL_TABLET | Freq: Two times a day (BID) | ORAL | Status: DC

## 2015-07-15 MED ORDER — BUPROPION HCL ER (XL) 300 MG PO TB24: 300.0000 mg | ORAL_TABLET | Freq: Every day | ORAL | Status: DC

## 2015-07-15 MED ORDER — TIZANIDINE HCL 4 MG PO TABS: 4.0000 mg | ORAL_TABLET | Freq: Every day | ORAL | Status: DC

## 2015-07-15 MED ORDER — ZOLPIDEM TARTRATE ER 12.5 MG PO TBCR: 12.5000 mg | EXTENDED_RELEASE_TABLET | Freq: Every evening | ORAL | Status: DC | PRN

## 2015-07-15 NOTE — Patient Instructions (Signed)
     IF you received an x-ray today, you will receive an invoice from [facility]. Please contact [facility] at [phone] with questions or concerns regarding your invoice.   IF you received labwork today, you will receive an invoice from [facility]. Please contact [facility] at [phone] with questions or concerns regarding your invoice.   Our billing staff will not be able to assist you with questions regarding bills from these companies.  You will be contacted with the lab results as soon as they are available. The fastest way to get your results is to activate your My Chart account. Instructions are located on the last page of this paperwork. If you have not heard from us regarding the results in 2 weeks, please contact this office.      
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
## 2015-10-17 ENCOUNTER — Telehealth: Payer: Self-pay

## 2015-10-17 DIAGNOSIS — F900 Attention-deficit hyperactivity disorder, predominantly inattentive type: Secondary | ICD-10-CM

## 2015-10-17 NOTE — Telephone Encounter (Signed)
Pt is needing a refill on adderall for 3 months  Best number [phone]

## 2015-10-25 MED ORDER — AMPHETAMINE-DEXTROAMPHETAMINE 20 MG PO TABS: 20.0000 mg | ORAL_TABLET | Freq: Two times a day (BID) | ORAL | Status: DC

## 2015-10-25 NOTE — Telephone Encounter (Signed)
Meds ordered this encounter  Medications  . amphetamine-dextroamphetamine (ADDERALL) 20 MG tablet    Sig: Take 1 tablet (20 mg total) by mouth 2 (two) times daily.    Dispense:  60 tablet    Refill:  0    Order Specific Question:  Supervising Provider    Answer:  SHAW, EVA N [[id number]]  . amphetamine-dextroamphetamine (ADDERALL) 20 MG tablet    Sig: Take 1 tablet (20 mg total) by mouth 2 (two) times daily. For 30d after signed or after    Dispense:  60 tablet    Refill:  0    Order Specific Question:  Supervising Provider    Answer:  Clelia CroftSHAW, EVA N [[id number]]  . amphetamine-dextroamphetamine (ADDERALL) 20 MG tablet    Sig: Take 1 tablet (20 mg total) by mouth 2 (two) times daily. For 60d after signed or after    Dispense:  60 tablet    Refill:  0    Order Specific Question:  Supervising Provider    Answer:  Clelia CroftSHAW, EVA N [[id number]]    Please advise patient that he'll need to establish with Ms. Weber, PA-C for additional prescriptions of this medication.  I would encourage him to schedule a traditional appointment with her.

## 2015-10-25 NOTE — Telephone Encounter (Signed)
advised rx at front desk for pick up = Adderall

## 2016-01-20 ENCOUNTER — Ambulatory Visit (INDEPENDENT_AMBULATORY_CARE_PROVIDER_SITE_OTHER): Payer: BLUE CROSS/BLUE SHIELD | Admitting: Physician Assistant

## 2016-01-20 VITALS — BP 116/80 | HR 85 | Temp 98.5°F | Resp 17 | Ht 71.0 in | Wt 183.0 lb

## 2016-01-20 DIAGNOSIS — G4700 Insomnia, unspecified: Secondary | ICD-10-CM | POA: Diagnosis not present

## 2016-01-20 DIAGNOSIS — M542 Cervicalgia: Secondary | ICD-10-CM | POA: Diagnosis not present

## 2016-01-20 DIAGNOSIS — F900 Attention-deficit hyperactivity disorder, predominantly inattentive type: Secondary | ICD-10-CM

## 2016-01-20 DIAGNOSIS — F339 Major depressive disorder, recurrent, unspecified: Secondary | ICD-10-CM

## 2016-01-20 MED ORDER — AMPHETAMINE-DEXTROAMPHETAMINE 20 MG PO TABS: 20.0000 mg | ORAL_TABLET | Freq: Two times a day (BID) | ORAL | 0 refills | Status: DC

## 2016-01-20 MED ORDER — ZOLPIDEM TARTRATE ER 12.5 MG PO TBCR: 12.5000 mg | EXTENDED_RELEASE_TABLET | Freq: Every evening | ORAL | 5 refills | Status: DC | PRN

## 2016-01-20 MED ORDER — BUPROPION HCL ER (XL) 300 MG PO TB24: 300.0000 mg | ORAL_TABLET | Freq: Every day | ORAL | 1 refills | Status: DC

## 2016-01-20 MED ORDER — TIZANIDINE HCL 4 MG PO TABS: 4.0000 mg | ORAL_TABLET | Freq: Every day | ORAL | 5 refills | Status: DC
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
## 2016-01-20 NOTE — Patient Instructions (Addendum)
I have filled the prescriptions.  Please follow up with Stanley Miller, or me at your next visit.  I do think Stanley Miller is suggesting that your follow up should be with Stanley Miller.      IF you received an x-ray today, you will receive an invoice from [facility]. Please contact [facility] at [phone] with questions or concerns regarding your invoice.   IF you received labwork today, you will receive an invoice from [facility]. Please contact [facility] at [phone] with questions or concerns regarding your invoice.   Our billing staff will not be able to assist you with questions regarding bills from these companies.  You will be contacted with the lab results as soon as they are available. The fastest way to get your results is to activate your My Chart account. Instructions are located on the last page of this paperwork. If you have not heard from us regarding the results in 2 weeks, please contact this office.

## 2016-05-03 ENCOUNTER — Ambulatory Visit (INDEPENDENT_AMBULATORY_CARE_PROVIDER_SITE_OTHER): Payer: BLUE CROSS/BLUE SHIELD | Admitting: Physician Assistant

## 2016-05-03 VITALS — BP 120/84 | HR 77 | Temp 97.9°F | Resp 16 | Ht 71.0 in | Wt 180.0 lb

## 2016-05-03 DIAGNOSIS — G4700 Insomnia, unspecified: Secondary | ICD-10-CM

## 2016-05-03 DIAGNOSIS — F900 Attention-deficit hyperactivity disorder, predominantly inattentive type: Secondary | ICD-10-CM

## 2016-05-03 MED ORDER — AMPHETAMINE-DEXTROAMPHETAMINE 20 MG PO TABS: 20.0000 mg | ORAL_TABLET | Freq: Two times a day (BID) | ORAL | 0 refills | Status: DC

## 2016-05-03 MED ORDER — TRAZODONE HCL 50 MG PO TABS: 25.0000 mg | ORAL_TABLET | Freq: Every evening | ORAL | 0 refills | Status: DC | PRN
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
## 2016-05-03 NOTE — Patient Instructions (Addendum)
To start the Trazodone - start with 1/2 pill at night for the 1st 4 nights - increase the dose by 25mg (1/2 pill) every 4-5 nights until either you sleep well, have side effects or reach a nightly dose of 150mg      IF you received an x-ray today, you will receive an invoice from [facility]. Please contact [facility] at [phone] with questions or concerns regarding your invoice.   IF you received labwork today, you will receive an invoice from [facility]. Please contact [facility] at [phone] with questions or concerns regarding your invoice.   Our billing staff will not be able to assist you with questions regarding bills from these companies.  You will be contacted with the lab results as soon as they are available. The fastest way to get your results is to activate your My Chart account. Instructions are located on the last page of this paperwork. If you have not heard from us regarding the results in 2 weeks, please contact this office.     

## 2016-05-30 ENCOUNTER — Other Ambulatory Visit: Payer: Self-pay | Admitting: Physician Assistant

## 2016-05-30 DIAGNOSIS — G4700 Insomnia, unspecified: Secondary | ICD-10-CM

## 2016-05-31 NOTE — Telephone Encounter (Signed)
From my review, it looks like you were working on titration of his trazodone dose and wanted to know/hear from the patient for more refills. Otherwise, I would have just refilled it. Sorry if you preferred the latter.

## 2016-06-03 NOTE — Telephone Encounter (Signed)
LM to return call with clarification of current trazodone dose.

## 2016-06-03 NOTE — Telephone Encounter (Signed)
Spoke with patient. He is taking 50 mg each night. Prefers 90-day supply.  Meds ordered this encounter  Medications  . traZODone (DESYREL) 50 MG tablet    Sig: Take 1 tablet (50 mg total) by mouth at bedtime.    Dispense:  90 tablet    Refill:  0

## 2016-06-03 NOTE — Telephone Encounter (Signed)
Can we please call the patient to see what dose of Trazodone he is currently using and to make sure it is effective and I will send in the Rx at that dose.

## 2016-06-30 IMAGING — CR DG CHEST 2V
2 series · 2 of 2 positions shown · non-contrast
Comparison: None.

CLINICAL DATA: Left-sided chest pain.

EXAM:
CHEST  2 VIEW

[lateral]
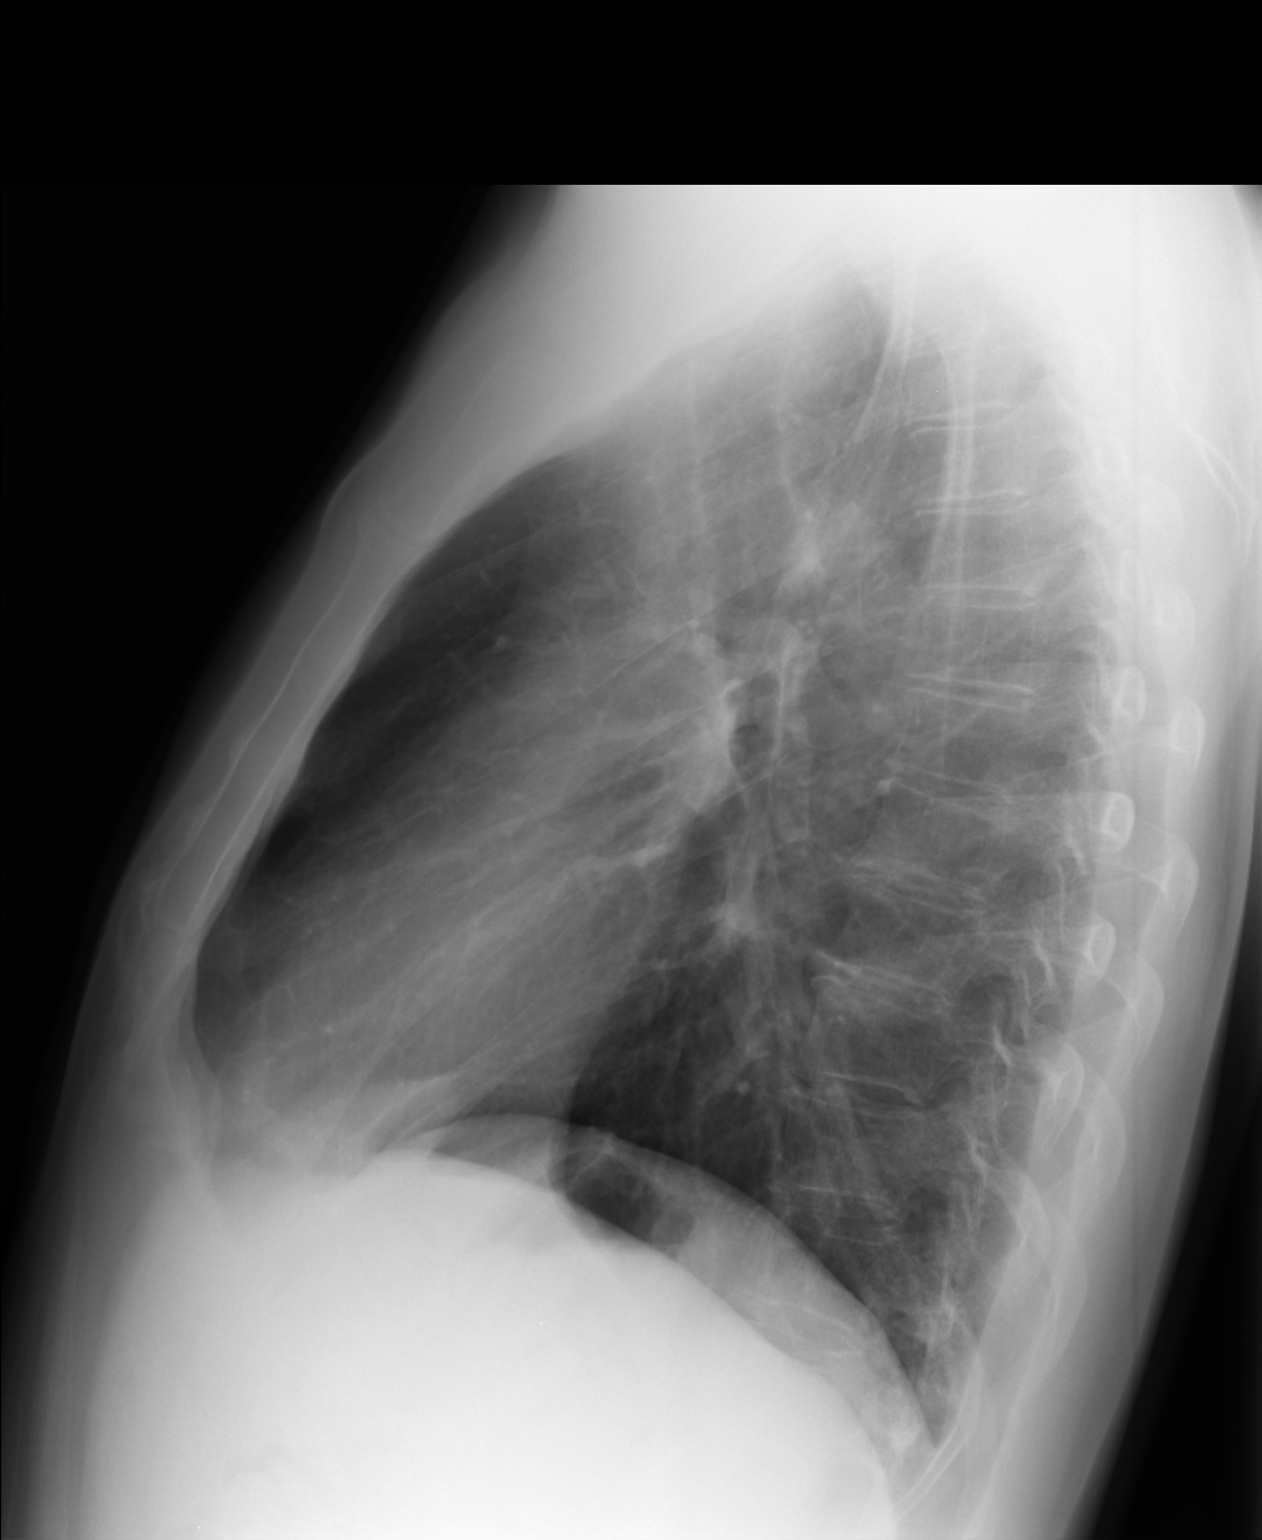

[PA]
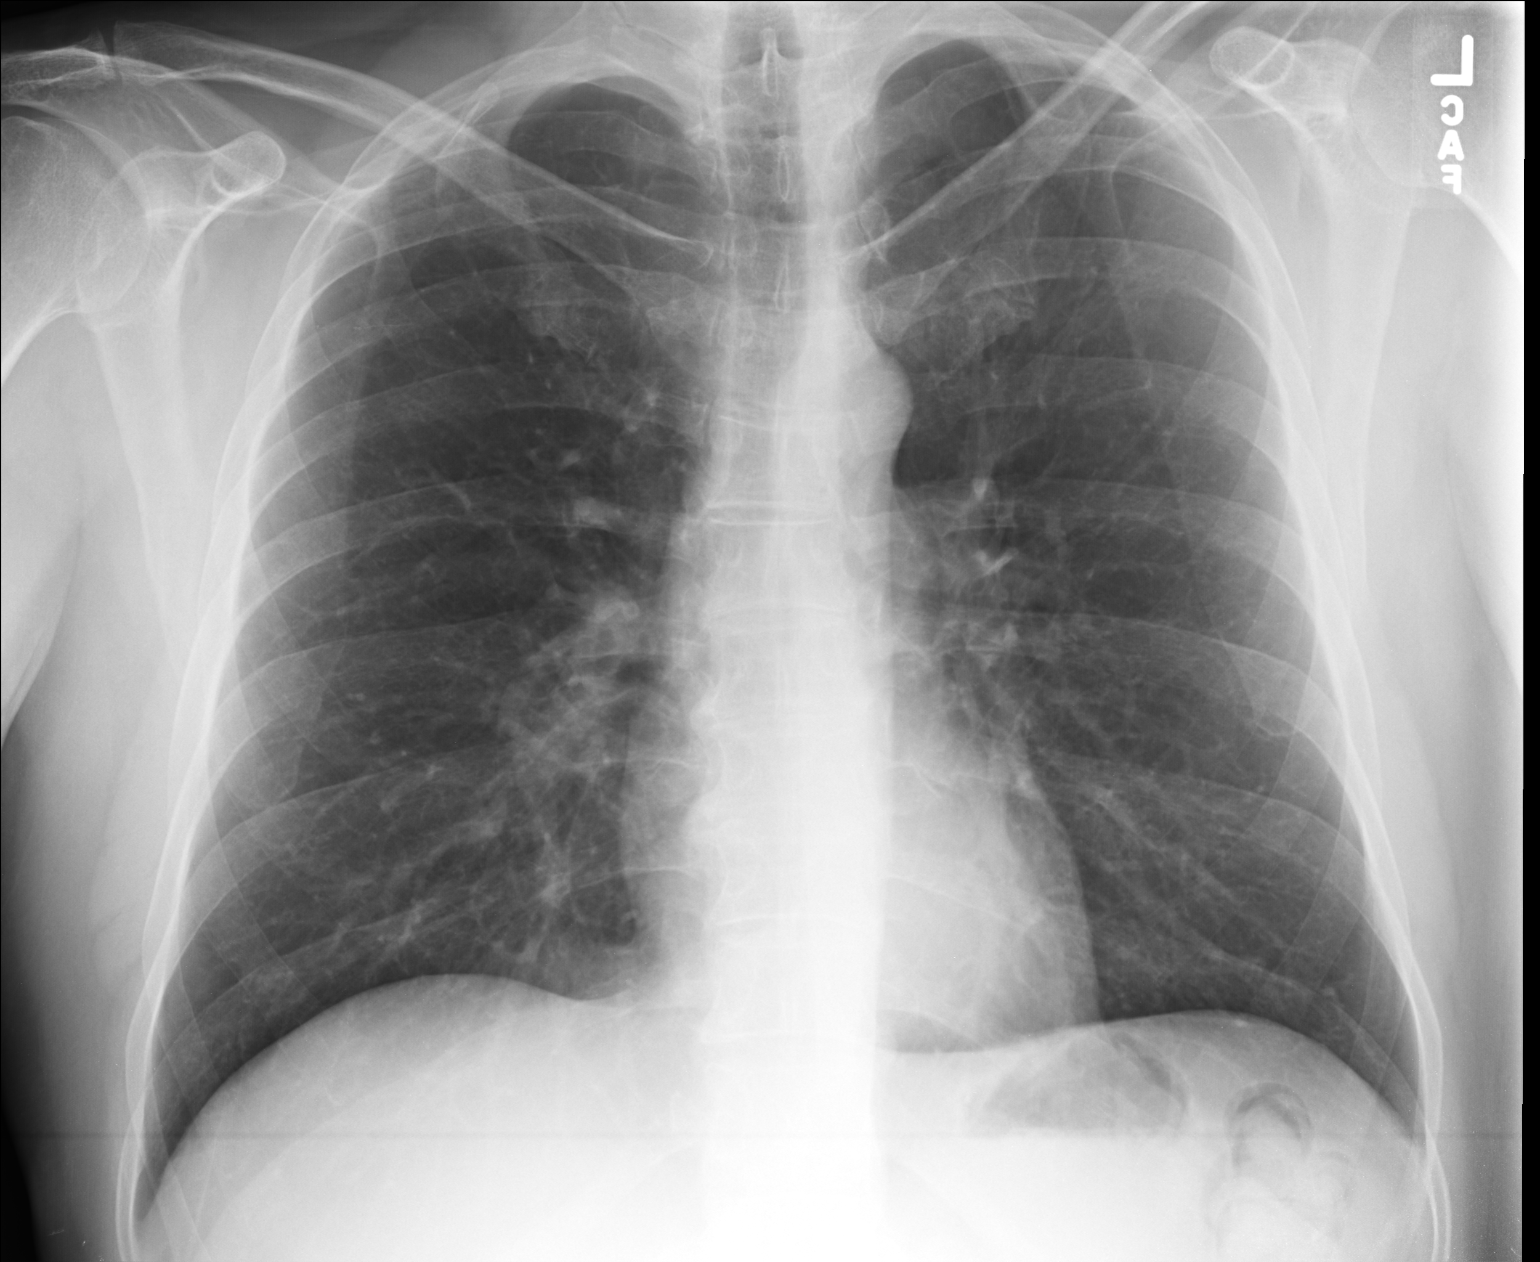

[2 of 2 positions shown; findings below may reference images not displayed]

FINDINGS: The heart size and mediastinal contours are within normal limits.
Both lungs are clear. The visualized skeletal structures are
unremarkable.
IMPRESSION: No active cardiopulmonary disease.

## 2016-07-30 ENCOUNTER — Ambulatory Visit (INDEPENDENT_AMBULATORY_CARE_PROVIDER_SITE_OTHER): Payer: Commercial Managed Care - PPO | Admitting: Physician Assistant

## 2016-07-30 ENCOUNTER — Encounter: Payer: Self-pay | Admitting: Physician Assistant

## 2016-07-30 VITALS — BP 115/80 | HR 96 | Temp 98.2°F | Resp 16 | Ht 71.0 in | Wt 182.0 lb

## 2016-07-30 DIAGNOSIS — M542 Cervicalgia: Secondary | ICD-10-CM

## 2016-07-30 DIAGNOSIS — N529 Male erectile dysfunction, unspecified: Secondary | ICD-10-CM

## 2016-07-30 DIAGNOSIS — Z13220 Encounter for screening for lipoid disorders: Secondary | ICD-10-CM | POA: Diagnosis not present

## 2016-07-30 DIAGNOSIS — F339 Major depressive disorder, recurrent, unspecified: Secondary | ICD-10-CM

## 2016-07-30 DIAGNOSIS — G4700 Insomnia, unspecified: Secondary | ICD-10-CM | POA: Diagnosis not present

## 2016-07-30 DIAGNOSIS — F900 Attention-deficit hyperactivity disorder, predominantly inattentive type: Secondary | ICD-10-CM | POA: Diagnosis not present

## 2016-07-30 DIAGNOSIS — Z114 Encounter for screening for human immunodeficiency virus [HIV]: Secondary | ICD-10-CM | POA: Diagnosis not present

## 2016-07-30 MED ORDER — TIZANIDINE HCL 4 MG PO TABS: 4.0000 mg | ORAL_TABLET | Freq: Every day | ORAL | 1 refills | Status: DC

## 2016-07-30 MED ORDER — ZOLPIDEM TARTRATE ER 12.5 MG PO TBCR: 12.5000 mg | EXTENDED_RELEASE_TABLET | Freq: Every evening | ORAL | 5 refills | Status: DC | PRN

## 2016-07-30 MED ORDER — BUPROPION HCL ER (XL) 300 MG PO TB24: 300.0000 mg | ORAL_TABLET | Freq: Every day | ORAL | 1 refills | Status: DC

## 2016-07-30 MED ORDER — AMPHETAMINE-DEXTROAMPHETAMINE 20 MG PO TABS: 20.0000 mg | ORAL_TABLET | Freq: Two times a day (BID) | ORAL | 0 refills | Status: DC

## 2016-07-30 MED ORDER — TRAZODONE HCL 50 MG PO TABS: 50.0000 mg | ORAL_TABLET | Freq: Every day | ORAL | 0 refills | Status: DC
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
## 2016-07-30 NOTE — Assessment & Plan Note (Signed)
Well controlled on medications - continue medications- pt give a 3 months supply - will contact me in 3 months for his next set of Rx.

## 2016-07-30 NOTE — Progress Notes (Signed)
finall

## 2016-07-30 NOTE — Patient Instructions (Signed)
     IF you received an x-ray today, you will receive an invoice from [facility]. Please contact [facility] at [phone] with questions or concerns regarding your invoice.   IF you received labwork today, you will receive an invoice from [facility]. Please contact [facility] at [phone] with questions or concerns regarding your invoice.   Our billing staff will not be able to assist you with questions regarding bills from these companies.  You will be contacted with the lab results as soon as they are available. The fastest way to get your results is to activate your My Chart account. Instructions are located on the last page of this paperwork. If you have not heard from us regarding the results in 2 weeks, please contact this office.     

## 2016-07-30 NOTE — Assessment & Plan Note (Signed)
Continue wellbutrin - it is working well for the patient.

## 2016-07-30 NOTE — Assessment & Plan Note (Signed)
Check testosterone - pt given a copy of his lab levels - he will f/u with urology

## 2016-07-30 NOTE — Assessment & Plan Note (Signed)
Continue muscle relaxer at night.

## 2016-07-30 NOTE — Assessment & Plan Note (Signed)
Continue current medications. 

## 2016-08-01 ENCOUNTER — Encounter: Payer: Self-pay | Admitting: Physician Assistant

## 2016-08-02 LAB — LIPID PANEL
Chol/HDL Ratio: 4.5 ratio (ref 0.0–5.0)
Cholesterol, Total: 187 mg/dL (ref 100–199)
HDL: 42 mg/dL (ref >39)
LDL Calculated: 104 mg/dL — ABNORMAL HIGH (ref 0–99)
Triglycerides: 206 mg/dL — ABNORMAL HIGH (ref 0–149)
VLDL Cholesterol Cal: 41 mg/dL — ABNORMAL HIGH (ref 5–40)

## 2016-08-02 LAB — TESTT+TESTF+SHBG
Sex Hormone Binding: 22.2 nmol/L (ref 16.5–55.9)
Testosterone, Free: 6.7 pg/mL — ABNORMAL LOW (ref 6.8–21.5)
Testosterone, total: 259.6 ng/dL — ABNORMAL LOW (ref 264.0–916.0)

## 2016-08-02 LAB — VITAMIN D 25 HYDROXY (VIT D DEFICIENCY, FRACTURES): Vit D, 25-Hydroxy: 33.1 ng/mL (ref 30.0–100.0)

## 2016-08-02 LAB — TSH: TSH: 1.69 u[IU]/mL (ref 0.450–4.500)

## 2016-08-02 LAB — HIV ANTIBODY (ROUTINE TESTING W REFLEX): HIV Screen 4th Generation wRfx: NONREACTIVE

## 2016-08-12 ENCOUNTER — Telehealth: Payer: Self-pay

## 2016-08-12 NOTE — Telephone Encounter (Signed)
Patient called today requesting info on his adderall PA.  I explained to him that I have been doing more than one job the last two weeks and have been away from my desk.  I apologized to him and told him that I would work on it right now.  I submitted the request through cover my meds.  It said there would be a 72 hour turnaround period even though I had submitted it as an urgent request.  I called patient back and gave him the number to my direct line.  I advised him to call me back Wednesday and hopefully we would have an answer by then, as it usually does not take 72 hours for a response.  He was very grateful for my help and said he will call me Wednesday.

## 2016-08-13 NOTE — Telephone Encounter (Signed)
Patient's medication was approved today. LMVM stating such.  Approval #[id number] active until 08/12/2017.

## 2016-11-11 ENCOUNTER — Other Ambulatory Visit: Payer: Self-pay | Admitting: Physician Assistant

## 2016-11-11 DIAGNOSIS — G4700 Insomnia, unspecified: Secondary | ICD-10-CM

## 2016-11-21 ENCOUNTER — Encounter: Payer: Self-pay | Admitting: Physician Assistant

## 2016-11-21 ENCOUNTER — Ambulatory Visit (INDEPENDENT_AMBULATORY_CARE_PROVIDER_SITE_OTHER): Payer: Commercial Managed Care - PPO | Admitting: Physician Assistant

## 2016-11-21 DIAGNOSIS — F900 Attention-deficit hyperactivity disorder, predominantly inattentive type: Secondary | ICD-10-CM | POA: Diagnosis not present

## 2016-11-21 DIAGNOSIS — F339 Major depressive disorder, recurrent, unspecified: Secondary | ICD-10-CM

## 2016-11-21 DIAGNOSIS — G4700 Insomnia, unspecified: Secondary | ICD-10-CM | POA: Diagnosis not present

## 2016-11-21 DIAGNOSIS — M542 Cervicalgia: Secondary | ICD-10-CM | POA: Diagnosis not present

## 2016-11-21 MED ORDER — TIZANIDINE HCL 4 MG PO TABS: 4.0000 mg | ORAL_TABLET | Freq: Every day | ORAL | 1 refills | Status: DC

## 2016-11-21 MED ORDER — AMPHETAMINE-DEXTROAMPHETAMINE 20 MG PO TABS: 20.0000 mg | ORAL_TABLET | Freq: Two times a day (BID) | ORAL | 0 refills | Status: DC

## 2016-11-21 MED ORDER — TRAZODONE HCL 50 MG PO TABS: ORAL_TABLET | ORAL | 1 refills | Status: DC

## 2016-11-21 MED ORDER — BUPROPION HCL ER (XL) 300 MG PO TB24: 300.0000 mg | ORAL_TABLET | Freq: Every day | ORAL | 1 refills | Status: DC

## 2016-11-21 MED ORDER — ZOLPIDEM TARTRATE ER 12.5 MG PO TBCR: 12.5000 mg | EXTENDED_RELEASE_TABLET | Freq: Every evening | ORAL | 5 refills | Status: DC | PRN
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
## 2016-11-21 NOTE — Patient Instructions (Signed)
     IF you received an x-ray today, you will receive an invoice from [facility]. Please contact [facility] at [phone] with questions or concerns regarding your invoice.   IF you received labwork today, you will receive an invoice from [facility]. Please contact [facility] at [phone] with questions or concerns regarding your invoice.   Our billing staff will not be able to assist you with questions regarding bills from these companies.  You will be contacted with the lab results as soon as they are available. The fastest way to get your results is to activate your My Chart account. Instructions are located on the last page of this paperwork. If you have not heard from us regarding the results in 2 weeks, please contact this office.     

## 2017-04-15 ENCOUNTER — Other Ambulatory Visit: Payer: Self-pay | Admitting: Physician Assistant

## 2017-04-15 DIAGNOSIS — M542 Cervicalgia: Secondary | ICD-10-CM

## 2017-04-15 DIAGNOSIS — G4700 Insomnia, unspecified: Secondary | ICD-10-CM

## 2017-04-15 DIAGNOSIS — F339 Major depressive disorder, recurrent, unspecified: Secondary | ICD-10-CM

## 2017-04-15 DIAGNOSIS — F900 Attention-deficit hyperactivity disorder, predominantly inattentive type: Secondary | ICD-10-CM

## 2017-04-15 NOTE — Telephone Encounter (Signed)
Copied from CRM [phone]. Topic: Quick Communication - See Telephone Encounter >> Apr 15, 2017  9:15 AM [name], Stanley Miller, NT wrote: CRM for notification. See Telephone encounter for: Patient calling because he needs a refill on all his medication Adderall, Zanaflex, Wellbutrin, Desyrel, and Ambien. If someone could give him a call back about this at [phone]  04/15/17.

## 2017-04-15 NOTE — Telephone Encounter (Signed)
Patient called at [phone] to discuss refill request, left VM to return call.

## 2017-04-15 NOTE — Telephone Encounter (Signed)
Patient called in for refills of Zanaflex, Ambien, Wellbutrin, Adderall, Desyrel. Last OV 11/21/16.

## 2017-04-15 NOTE — Telephone Encounter (Signed)
Called patient back to clarify his question.  Patient is just wanted the medications refilled.  I advised I would send them to the provider and the office would call him back when they are ready for pick.  Patient verified understanding.

## 2017-04-15 NOTE — Telephone Encounter (Signed)
Copied from CRM [id number]. Topic: General - Other >> Apr 15, 2017 10:26 AM Viviann SpareWhite, Selina wrote: Reason for CRM: Patient returned office call. Tried to get NT on the line, they helping other patient. Patient is requesting a call back.

## 2017-04-18 NOTE — Telephone Encounter (Signed)
Pt req refills on Zanaflex,Ambien, Wellbutrin, Adderall, Desyrel Sent to Maralyn SagoSarah

## 2017-04-18 NOTE — Telephone Encounter (Signed)
Patient is calling back in regards to this. Can someone please forward to the covering provider.

## 2017-04-18 NOTE — Addendum Note (Signed)
Addended by: Matt HolmesGAISER, Marveline Profeta B on: 04/18/2017 11:03 AM   Modules accepted: Orders

## 2017-04-19 MED ORDER — TIZANIDINE HCL 4 MG PO TABS: 4.0000 mg | ORAL_TABLET | Freq: Every day | ORAL | 0 refills | Status: DC

## 2017-04-19 MED ORDER — TRAZODONE HCL 50 MG PO TABS: ORAL_TABLET | ORAL | 0 refills | Status: DC

## 2017-04-19 MED ORDER — AMPHETAMINE-DEXTROAMPHETAMINE 20 MG PO TABS: 20.0000 mg | ORAL_TABLET | Freq: Two times a day (BID) | ORAL | 0 refills | Status: DC

## 2017-04-19 MED ORDER — ZOLPIDEM TARTRATE ER 12.5 MG PO TBCR: 12.5000 mg | EXTENDED_RELEASE_TABLET | Freq: Every evening | ORAL | 0 refills | Status: DC | PRN

## 2017-04-19 MED ORDER — BUPROPION HCL ER (XL) 300 MG PO TB24: 300.0000 mg | ORAL_TABLET | Freq: Every day | ORAL | 0 refills | Status: DC

## 2017-04-19 NOTE — Addendum Note (Signed)
Addended by: Fernande BrasJEFFERY, Bricen Victory S on: 04/19/2017 05:25 PM   Modules accepted: Orders

## 2017-04-19 NOTE — Telephone Encounter (Signed)
Patient notified via My Chart.  Rx sent electronically.  Meds ordered this encounter  Medications  . buPROPion (WELLBUTRIN XL) 300 MG 24 hr tablet    Sig: Take 1 tablet (300 mg total) by mouth daily.    Dispense:  90 tablet    Refill:  0  . zolpidem (AMBIEN CR) 12.5 MG CR tablet    Sig: Take 1 tablet (12.5 mg total) by mouth at bedtime as needed for sleep.    Dispense:  30 tablet    Refill:  0  . tiZANidine (ZANAFLEX) 4 MG tablet    Sig: Take 1 tablet (4 mg total) by mouth at bedtime.    Dispense:  90 tablet    Refill:  0  . traZODone (DESYREL) 50 MG tablet    Sig: TAKE 1 TABLET(50 MG) BY MOUTH AT BEDTIME    Dispense:  90 tablet    Refill:  0  . amphetamine-dextroamphetamine (ADDERALL) 20 MG tablet    Sig: Take 1 tablet (20 mg total) by mouth 2 (two) times daily. For 60d after signed or after    Dispense:  60 tablet    Refill:  0  . amphetamine-dextroamphetamine (ADDERALL) 20 MG tablet    Sig: Take 1 tablet (20 mg total) by mouth 2 (two) times daily.    Dispense:  60 tablet    Refill:  0  . amphetamine-dextroamphetamine (ADDERALL) 20 MG tablet    Sig: Take 1 tablet (20 mg total) by mouth 2 (two) times daily. For 30d after signed or after    Dispense:  60 tablet    Refill:  0

## 2017-04-21 ENCOUNTER — Telehealth: Payer: Self-pay

## 2017-04-21 NOTE — Telephone Encounter (Signed)
Called in rx for ambien 

## 2017-05-22 ENCOUNTER — Other Ambulatory Visit: Payer: Self-pay | Admitting: Physician Assistant

## 2017-05-22 DIAGNOSIS — G4700 Insomnia, unspecified: Secondary | ICD-10-CM

## 2017-05-22 NOTE — Telephone Encounter (Signed)
ambien   refill Last OV:   11/21/16 Last Refill:   04/19/17 Controlled med. Please review. No upcoming appointment noted.

## 2017-05-23 ENCOUNTER — Ambulatory Visit: Payer: Commercial Managed Care - PPO | Admitting: Physician Assistant

## 2017-05-23 ENCOUNTER — Other Ambulatory Visit: Payer: Self-pay

## 2017-05-23 ENCOUNTER — Encounter: Payer: Self-pay | Admitting: Physician Assistant

## 2017-05-23 DIAGNOSIS — F339 Major depressive disorder, recurrent, unspecified: Secondary | ICD-10-CM | POA: Diagnosis not present

## 2017-05-23 DIAGNOSIS — G4700 Insomnia, unspecified: Secondary | ICD-10-CM

## 2017-05-23 DIAGNOSIS — M542 Cervicalgia: Secondary | ICD-10-CM

## 2017-05-23 DIAGNOSIS — F900 Attention-deficit hyperactivity disorder, predominantly inattentive type: Secondary | ICD-10-CM | POA: Diagnosis not present

## 2017-05-23 MED ORDER — BUPROPION HCL ER (XL) 300 MG PO TB24: 300.0000 mg | ORAL_TABLET | Freq: Every day | ORAL | 3 refills | Status: AC

## 2017-05-23 MED ORDER — ZOLPIDEM TARTRATE ER 12.5 MG PO TBCR: 12.5000 mg | EXTENDED_RELEASE_TABLET | Freq: Every evening | ORAL | 5 refills | Status: DC | PRN

## 2017-05-23 MED ORDER — TIZANIDINE HCL 4 MG PO TABS: 4.0000 mg | ORAL_TABLET | Freq: Every day | ORAL | 3 refills | Status: AC

## 2017-05-23 MED ORDER — AMPHETAMINE-DEXTROAMPHETAMINE 20 MG PO TABS: 20.0000 mg | ORAL_TABLET | Freq: Two times a day (BID) | ORAL | 0 refills | Status: DC

## 2017-05-23 MED ORDER — TRAZODONE HCL 50 MG PO TABS: ORAL_TABLET | ORAL | 3 refills | Status: AC

## 2017-05-23 NOTE — Patient Instructions (Signed)
     IF you received an x-ray today, you will receive an invoice from [facility]. Please contact [facility] at [phone] with questions or concerns regarding your invoice.   IF you received labwork today, you will receive an invoice from [facility]. Please contact [facility] at [phone] with questions or concerns regarding your invoice.   Our billing staff will not be able to assist you with questions regarding bills from these companies.  You will be contacted with the lab results as soon as they are available. The fastest way to get your results is to activate your My Chart account. Instructions are located on the last page of this paperwork. If you have not heard from us regarding the results in 2 weeks, please contact this office.     
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
## 2017-07-16 ENCOUNTER — Encounter: Payer: Self-pay | Admitting: Physician Assistant

## 2017-08-18 ENCOUNTER — Encounter: Payer: Self-pay | Admitting: Physician Assistant

## 2017-08-18 ENCOUNTER — Ambulatory Visit: Payer: Commercial Managed Care - PPO | Admitting: Physician Assistant

## 2017-08-18 VITALS — BP 116/64 | HR 101 | Temp 99.3°F | Resp 18 | Ht 71.0 in | Wt 188.0 lb

## 2017-08-18 DIAGNOSIS — S161XXA Strain of muscle, fascia and tendon at neck level, initial encounter: Secondary | ICD-10-CM | POA: Diagnosis not present

## 2017-08-18 DIAGNOSIS — M542 Cervicalgia: Secondary | ICD-10-CM

## 2017-08-18 DIAGNOSIS — F900 Attention-deficit hyperactivity disorder, predominantly inattentive type: Secondary | ICD-10-CM

## 2017-08-18 DIAGNOSIS — R0982 Postnasal drip: Secondary | ICD-10-CM | POA: Diagnosis not present

## 2017-08-18 MED ORDER — MOMETASONE FUROATE 50 MCG/ACT NA SUSP: 2.0000 | Freq: Every day | NASAL | 0 refills | Status: DC

## 2017-08-18 MED ORDER — MELOXICAM 7.5 MG PO TABS: 7.5000 mg | ORAL_TABLET | Freq: Every day | ORAL | 0 refills | Status: AC

## 2017-08-18 MED ORDER — AMPHETAMINE-DEXTROAMPHETAMINE 20 MG PO TABS: 20.0000 mg | ORAL_TABLET | Freq: Two times a day (BID) | ORAL | 0 refills | Status: AC

## 2017-08-18 MED ORDER — GUAIFENESIN ER 1200 MG PO TB12: 1.0000 | ORAL_TABLET | Freq: Two times a day (BID) | ORAL | 0 refills | Status: AC
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
## 2017-08-19 LAB — TSH: TSH: 1.59 u[IU]/mL (ref 0.450–4.500)

## 2017-09-17 ENCOUNTER — Other Ambulatory Visit: Payer: Self-pay | Admitting: Physician Assistant

## 2017-09-17 DIAGNOSIS — M542 Cervicalgia: Secondary | ICD-10-CM

## 2017-09-18 NOTE — Telephone Encounter (Signed)
Meloxicam refill Last Refill:08/18/17 # 30 No RF Last OV: 08/18/17 PCP: Benny LennertSarah Weber PA Pharmacy:[facility] [address]

## 2017-10-30 ENCOUNTER — Other Ambulatory Visit: Payer: Self-pay | Admitting: Physician Assistant

## 2017-10-30 DIAGNOSIS — R0982 Postnasal drip: Secondary | ICD-10-CM

## 2017-10-31 NOTE — Telephone Encounter (Signed)
LOV 08/18/17 with Stanley LennertSarah Miller / Refill request for Nasonex / Last filled:  Disp Refills Start End   mometasone (NASONEX) 50 MCG/ACT nasal spray 17 g 0 08/18/2017    Sig - Route: Place 2 sprays into the nose daily. - Nasal   Sent to pharmacy as: mometasone (NASONEX) 50 MCG/ACT nasal spray   Notes to Pharmacy: If insurance will not cover this - please direct OTC nasacort    / Refill protocol

## 2017-11-17 ENCOUNTER — Other Ambulatory Visit: Payer: Self-pay | Admitting: Physician Assistant

## 2017-11-17 DIAGNOSIS — G4700 Insomnia, unspecified: Secondary | ICD-10-CM

## 2017-11-18 ENCOUNTER — Ambulatory Visit: Payer: Commercial Managed Care - PPO | Admitting: Physician Assistant

## 2017-11-18 NOTE — Telephone Encounter (Signed)
zolpidem refill Last Refill:10/18/17 # 30 Last OV:2/15119 PCP: Benny LennertSarah Weber [facility] [address] [place]

## 2017-11-18 NOTE — Telephone Encounter (Signed)
Patient is requesting a refill of the following medications: Requested Prescriptions   Pending Prescriptions Disp Refills  . zolpidem (AMBIEN CR) 12.5 MG CR tablet [Pharmacy Med Name: ZOLPIDEM ER 12.5MG  TABLETS] 30 tablet 0    Sig: TAKE 1 TABLET(12.5 MG) BY MOUTH AT BEDTIME AS NEEDED FOR SLEEP    Date of patient request: 11/17/2017 Last office visit: 08/18/2017 Date of last refill: 05/23/2017 Last refill amount: 30  Follow up time period per chart:

## 2017-12-21 ENCOUNTER — Other Ambulatory Visit: Payer: Self-pay | Admitting: Physician Assistant

## 2017-12-21 DIAGNOSIS — G4700 Insomnia, unspecified: Secondary | ICD-10-CM

## 2017-12-22 NOTE — Telephone Encounter (Signed)
Patient is requesting a refill of the following medications: Requested Prescriptions   Pending Prescriptions Disp Refills  . zolpidem (AMBIEN CR) 12.5 MG CR tablet [Pharmacy Med Name: ZOLPIDEM ER 12.5MG  TABLETS] 30 tablet 0    Sig: TAKE 1 TABLET(12.5 MG) BY MOUTH AT BEDTIME AS NEEDED FOR SLEEP    Date of patient request:12/21/2017 Last office visit: 08/18/2017 Date of last refill: 11/18/2017 Last refill amount: 30 Follow up time period per chart:

## 2017-12-23 NOTE — Telephone Encounter (Signed)
I have given him 10 Ambein - He needs an OV for further refills of medications.  He is a ADD/mental health patient so I am happy if he wants to come with me and he asks he can have the info.  [facility] - he needs to see [place] or [place].

## 2018-01-03 ENCOUNTER — Other Ambulatory Visit: Payer: Self-pay

## 2018-01-03 ENCOUNTER — Ambulatory Visit (HOSPITAL_COMMUNITY)
Admission: EM | Admit: 2018-01-03 | Discharge: 2018-01-03 | Disposition: A | Discharge status: Home / Self Care | Payer: Commercial Managed Care - PPO | Attending: Family Medicine | Admitting: Family Medicine

## 2018-01-03 ENCOUNTER — Encounter (HOSPITAL_COMMUNITY): Payer: Self-pay

## 2018-01-03 DIAGNOSIS — Z760 Encounter for issue of repeat prescription: Secondary | ICD-10-CM

## 2018-01-03 NOTE — ED Notes (Signed)
Pt was here to get controlled substances refilled. Spoke with Dr. Tracie Harrier and he stated that we do not typically fill this types of medications here. Pt wanted to find where his original PCP went. Found her and gave him the information set up an appointment with her to follow up for more refills. Pt was not seen by a provider today.

## 2018-01-03 NOTE — ED Triage Notes (Signed)
Pt presents today with needing rx refills due to his PCP no longer being at his practice. Wants to see if he can find her to continue to care with her. Need refills on adderall, ambien, tramadol, tizanidine, and buproprion.

## 2018-01-13 NOTE — ED Provider Notes (Signed)
  [facility]   [id number] 01/03/18 Arrival Time: 1019  ASSESSMENT & PLAN:  1. Medication refill     Pt LWBS.    Mardella Layman, MD 01/13/18 [phone]

## 2019-07-01 ENCOUNTER — Ambulatory Visit: Payer: Self-pay

## 2019-07-02 ENCOUNTER — Ambulatory Visit: Payer: BLUE CROSS/BLUE SHIELD | Attending: Internal Medicine

## 2019-07-02 DIAGNOSIS — Z23 Encounter for immunization: Secondary | ICD-10-CM

## 2019-07-02 NOTE — Progress Notes (Signed)
   Covid-19 Vaccination Clinic  Name:  Stanley Miller    MRN: [id number] DOB: 02-Apr-1971  07/02/2019  Mr. Stanley Miller was observed post Covid-19 immunization for 15 minutes without incident. He was provided with Vaccine Information Sheet and instruction to access the V-Safe system.   Mr. Stanley Miller was instructed to call 911 with any severe reactions post vaccine: Marland Kitchen Difficulty breathing  . Swelling of face and throat  . A fast heartbeat  . A bad rash all over body  . Dizziness and weakness   Immunizations Administered    Name Date Dose VIS Date Route   Moderna COVID-19 Vaccine 07/02/2019  9:36 AM 0.5 mL 03/09/2019 Intramuscular   Manufacturer: Moderna   Lot: [id number]   NDC: 50510-712-52

## 2019-08-03 ENCOUNTER — Ambulatory Visit: Payer: BLUE CROSS/BLUE SHIELD | Attending: Internal Medicine

## 2019-08-03 DIAGNOSIS — Z23 Encounter for immunization: Secondary | ICD-10-CM

## 2019-08-03 NOTE — Progress Notes (Signed)
   Covid-19 Vaccination Clinic  Name:  DANNON NGUYENTHI    MRN: [id number] DOB: 05-02-1970  08/03/2019  Mr. Maske was observed post Covid-19 immunization for 15 minutes without incident. He was provided with Vaccine Information Sheet and instruction to access the V-Safe system.   Mr. Keimig was instructed to call 911 with any severe reactions post vaccine: Marland Kitchen Difficulty breathing  . Swelling of face and throat  . A fast heartbeat  . A bad rash all over body  . Dizziness and weakness   Immunizations Administered    Name Date Dose VIS Date Route   Moderna COVID-19 Vaccine 08/03/2019  8:45 AM 0.5 mL 03/2019 Intramuscular   Manufacturer: Moderna   Lot: [id number]   NDC: 46270-350-09
